# Patient Record
Sex: Female | Born: 1970
Health system: Southern US, Community
[De-identification: ages and names within clinical notes are randomized; demographics above are authoritative.]

## PROBLEM LIST (undated history)

## (undated) ENCOUNTER — Emergency Department (HOSPITAL_BASED_OUTPATIENT_CLINIC_OR_DEPARTMENT_OTHER): Payer: 59

## (undated) DIAGNOSIS — I10 Essential (primary) hypertension: Secondary | ICD-10-CM

## (undated) DIAGNOSIS — T8859XA Other complications of anesthesia, initial encounter: Secondary | ICD-10-CM

## (undated) HISTORY — PX: KNEE ARTHROSCOPY: SHX127

---

## 2013-10-18 ENCOUNTER — Other Ambulatory Visit (HOSPITAL_COMMUNITY)
Admission: RE | Admit: 2013-10-18 | Discharge: 2013-10-18 | Disposition: A | Discharge status: Home / Self Care | Payer: Commercial Indemnity | Source: Ambulatory Visit | Attending: Obstetrics & Gynecology | Admitting: Obstetrics & Gynecology

## 2013-10-18 DIAGNOSIS — Z01419 Encounter for gynecological examination (general) (routine) without abnormal findings: Secondary | ICD-10-CM | POA: Insufficient documentation

## 2013-10-18 DIAGNOSIS — Z1151 Encounter for screening for human papillomavirus (HPV): Secondary | ICD-10-CM | POA: Insufficient documentation

## 2014-08-06 ENCOUNTER — Other Ambulatory Visit: Payer: Self-pay

## 2014-08-06 DIAGNOSIS — Z1231 Encounter for screening mammogram for malignant neoplasm of breast: Secondary | ICD-10-CM

## 2014-08-21 ENCOUNTER — Ambulatory Visit
Admission: RE | Admit: 2014-08-21 | Discharge: 2014-08-21 | Disposition: A | Discharge status: Home / Self Care | Payer: BLUE CROSS/BLUE SHIELD | Source: Ambulatory Visit

## 2014-08-21 DIAGNOSIS — Z1231 Encounter for screening mammogram for malignant neoplasm of breast: Secondary | ICD-10-CM

## 2016-01-02 ENCOUNTER — Encounter (INDEPENDENT_AMBULATORY_CARE_PROVIDER_SITE_OTHER): Payer: 59 | Admitting: Ophthalmology

## 2016-01-02 DIAGNOSIS — H33022 Retinal detachment with multiple breaks, left eye: Secondary | ICD-10-CM

## 2016-01-02 DIAGNOSIS — I10 Essential (primary) hypertension: Secondary | ICD-10-CM

## 2016-01-02 DIAGNOSIS — H43813 Vitreous degeneration, bilateral: Secondary | ICD-10-CM | POA: Diagnosis not present

## 2016-01-02 DIAGNOSIS — H2512 Age-related nuclear cataract, left eye: Secondary | ICD-10-CM

## 2016-01-02 DIAGNOSIS — H33301 Unspecified retinal break, right eye: Secondary | ICD-10-CM

## 2016-01-02 DIAGNOSIS — H35413 Lattice degeneration of retina, bilateral: Secondary | ICD-10-CM | POA: Diagnosis not present

## 2016-01-02 DIAGNOSIS — H35033 Hypertensive retinopathy, bilateral: Secondary | ICD-10-CM

## 2016-01-08 ENCOUNTER — Encounter (INDEPENDENT_AMBULATORY_CARE_PROVIDER_SITE_OTHER): Payer: 59 | Admitting: Ophthalmology

## 2016-01-08 DIAGNOSIS — H33301 Unspecified retinal break, right eye: Secondary | ICD-10-CM | POA: Diagnosis not present

## 2016-01-29 ENCOUNTER — Ambulatory Visit (INDEPENDENT_AMBULATORY_CARE_PROVIDER_SITE_OTHER): Payer: 59 | Admitting: Ophthalmology

## 2016-02-02 ENCOUNTER — Ambulatory Visit (INDEPENDENT_AMBULATORY_CARE_PROVIDER_SITE_OTHER): Payer: 59 | Admitting: Ophthalmology

## 2016-02-02 DIAGNOSIS — H33333 Multiple defects of retina without detachment, bilateral: Secondary | ICD-10-CM

## 2016-06-03 ENCOUNTER — Ambulatory Visit (INDEPENDENT_AMBULATORY_CARE_PROVIDER_SITE_OTHER): Payer: 59 | Admitting: Ophthalmology

## 2016-06-25 ENCOUNTER — Ambulatory Visit (INDEPENDENT_AMBULATORY_CARE_PROVIDER_SITE_OTHER): Payer: 59 | Admitting: Ophthalmology

## 2016-06-25 DIAGNOSIS — H35033 Hypertensive retinopathy, bilateral: Secondary | ICD-10-CM | POA: Diagnosis not present

## 2016-06-25 DIAGNOSIS — H33303 Unspecified retinal break, bilateral: Secondary | ICD-10-CM

## 2016-06-25 DIAGNOSIS — I10 Essential (primary) hypertension: Secondary | ICD-10-CM | POA: Diagnosis not present

## 2016-06-25 DIAGNOSIS — H43813 Vitreous degeneration, bilateral: Secondary | ICD-10-CM | POA: Diagnosis not present

## 2016-07-02 DIAGNOSIS — J343 Hypertrophy of nasal turbinates: Secondary | ICD-10-CM | POA: Diagnosis not present

## 2016-07-02 DIAGNOSIS — J310 Chronic rhinitis: Secondary | ICD-10-CM | POA: Diagnosis not present

## 2016-07-16 ENCOUNTER — Other Ambulatory Visit (INDEPENDENT_AMBULATORY_CARE_PROVIDER_SITE_OTHER): Payer: Self-pay | Admitting: Otolaryngology

## 2016-07-16 DIAGNOSIS — J329 Chronic sinusitis, unspecified: Secondary | ICD-10-CM

## 2016-07-19 ENCOUNTER — Ambulatory Visit
Admission: RE | Admit: 2016-07-19 | Discharge: 2016-07-19 | Disposition: A | Discharge status: Home / Self Care | Payer: 59 | Source: Ambulatory Visit | Attending: Otolaryngology | Admitting: Otolaryngology

## 2016-07-19 DIAGNOSIS — R51 Headache: Secondary | ICD-10-CM | POA: Diagnosis not present

## 2016-07-19 DIAGNOSIS — J329 Chronic sinusitis, unspecified: Secondary | ICD-10-CM

## 2016-07-21 DIAGNOSIS — J310 Chronic rhinitis: Secondary | ICD-10-CM | POA: Diagnosis not present

## 2016-07-21 DIAGNOSIS — J343 Hypertrophy of nasal turbinates: Secondary | ICD-10-CM | POA: Diagnosis not present

## 2016-07-21 DIAGNOSIS — Z23 Encounter for immunization: Secondary | ICD-10-CM | POA: Diagnosis not present

## 2016-08-11 DIAGNOSIS — J343 Hypertrophy of nasal turbinates: Secondary | ICD-10-CM | POA: Diagnosis not present

## 2016-08-11 DIAGNOSIS — J310 Chronic rhinitis: Secondary | ICD-10-CM | POA: Diagnosis not present

## 2016-09-16 DIAGNOSIS — H1045 Other chronic allergic conjunctivitis: Secondary | ICD-10-CM | POA: Diagnosis not present

## 2016-09-16 DIAGNOSIS — J300 Vasomotor rhinitis: Secondary | ICD-10-CM | POA: Diagnosis not present

## 2016-09-16 DIAGNOSIS — T50995D Adverse effect of other drugs, medicaments and biological substances, subsequent encounter: Secondary | ICD-10-CM | POA: Diagnosis not present

## 2016-09-21 DIAGNOSIS — J301 Allergic rhinitis due to pollen: Secondary | ICD-10-CM | POA: Diagnosis not present

## 2016-09-22 DIAGNOSIS — J3081 Allergic rhinitis due to animal (cat) (dog) hair and dander: Secondary | ICD-10-CM | POA: Diagnosis not present

## 2016-09-22 DIAGNOSIS — J3089 Other allergic rhinitis: Secondary | ICD-10-CM | POA: Diagnosis not present

## 2016-09-29 DIAGNOSIS — J342 Deviated nasal septum: Secondary | ICD-10-CM | POA: Diagnosis not present

## 2016-09-29 DIAGNOSIS — J343 Hypertrophy of nasal turbinates: Secondary | ICD-10-CM | POA: Diagnosis not present

## 2016-10-14 DIAGNOSIS — J3089 Other allergic rhinitis: Secondary | ICD-10-CM | POA: Diagnosis not present

## 2016-10-14 DIAGNOSIS — J301 Allergic rhinitis due to pollen: Secondary | ICD-10-CM | POA: Diagnosis not present

## 2016-10-14 DIAGNOSIS — J3081 Allergic rhinitis due to animal (cat) (dog) hair and dander: Secondary | ICD-10-CM | POA: Diagnosis not present

## 2016-10-19 DIAGNOSIS — J3089 Other allergic rhinitis: Secondary | ICD-10-CM | POA: Diagnosis not present

## 2016-10-19 DIAGNOSIS — J3081 Allergic rhinitis due to animal (cat) (dog) hair and dander: Secondary | ICD-10-CM | POA: Diagnosis not present

## 2016-10-19 DIAGNOSIS — J301 Allergic rhinitis due to pollen: Secondary | ICD-10-CM | POA: Diagnosis not present

## 2016-10-22 DIAGNOSIS — J301 Allergic rhinitis due to pollen: Secondary | ICD-10-CM | POA: Diagnosis not present

## 2016-10-22 DIAGNOSIS — J3089 Other allergic rhinitis: Secondary | ICD-10-CM | POA: Diagnosis not present

## 2016-10-22 DIAGNOSIS — J3081 Allergic rhinitis due to animal (cat) (dog) hair and dander: Secondary | ICD-10-CM | POA: Diagnosis not present

## 2016-10-26 DIAGNOSIS — J301 Allergic rhinitis due to pollen: Secondary | ICD-10-CM | POA: Diagnosis not present

## 2016-10-26 DIAGNOSIS — J3081 Allergic rhinitis due to animal (cat) (dog) hair and dander: Secondary | ICD-10-CM | POA: Diagnosis not present

## 2016-10-26 DIAGNOSIS — J3089 Other allergic rhinitis: Secondary | ICD-10-CM | POA: Diagnosis not present

## 2016-10-29 DIAGNOSIS — J301 Allergic rhinitis due to pollen: Secondary | ICD-10-CM | POA: Diagnosis not present

## 2016-10-29 DIAGNOSIS — J3081 Allergic rhinitis due to animal (cat) (dog) hair and dander: Secondary | ICD-10-CM | POA: Diagnosis not present

## 2016-10-29 DIAGNOSIS — J3089 Other allergic rhinitis: Secondary | ICD-10-CM | POA: Diagnosis not present

## 2016-11-02 DIAGNOSIS — J301 Allergic rhinitis due to pollen: Secondary | ICD-10-CM | POA: Diagnosis not present

## 2016-11-02 DIAGNOSIS — J3081 Allergic rhinitis due to animal (cat) (dog) hair and dander: Secondary | ICD-10-CM | POA: Diagnosis not present

## 2016-11-02 DIAGNOSIS — J3089 Other allergic rhinitis: Secondary | ICD-10-CM | POA: Diagnosis not present

## 2016-11-05 DIAGNOSIS — J301 Allergic rhinitis due to pollen: Secondary | ICD-10-CM | POA: Diagnosis not present

## 2016-11-05 DIAGNOSIS — J3089 Other allergic rhinitis: Secondary | ICD-10-CM | POA: Diagnosis not present

## 2016-11-05 DIAGNOSIS — J3081 Allergic rhinitis due to animal (cat) (dog) hair and dander: Secondary | ICD-10-CM | POA: Diagnosis not present

## 2016-11-09 ENCOUNTER — Other Ambulatory Visit: Payer: Self-pay | Admitting: Obstetrics & Gynecology

## 2016-11-09 ENCOUNTER — Other Ambulatory Visit (HOSPITAL_COMMUNITY)
Admission: RE | Admit: 2016-11-09 | Discharge: 2016-11-09 | Disposition: A | Discharge status: Home / Self Care | Payer: 59 | Source: Ambulatory Visit | Attending: Obstetrics & Gynecology | Admitting: Obstetrics & Gynecology

## 2016-11-09 DIAGNOSIS — Z124 Encounter for screening for malignant neoplasm of cervix: Secondary | ICD-10-CM | POA: Diagnosis not present

## 2016-11-09 DIAGNOSIS — J310 Chronic rhinitis: Secondary | ICD-10-CM | POA: Diagnosis not present

## 2016-11-09 DIAGNOSIS — Z01419 Encounter for gynecological examination (general) (routine) without abnormal findings: Secondary | ICD-10-CM | POA: Diagnosis not present

## 2016-11-09 DIAGNOSIS — J343 Hypertrophy of nasal turbinates: Secondary | ICD-10-CM | POA: Diagnosis not present

## 2016-11-12 LAB — CYTOLOGY - PAP
DIAGNOSIS: NEGATIVE
HPV: NOT DETECTED

## 2016-11-19 ENCOUNTER — Other Ambulatory Visit (INDEPENDENT_AMBULATORY_CARE_PROVIDER_SITE_OTHER): Payer: Self-pay | Admitting: Otolaryngology

## 2016-11-19 DIAGNOSIS — J343 Hypertrophy of nasal turbinates: Secondary | ICD-10-CM | POA: Diagnosis not present

## 2016-11-19 DIAGNOSIS — J342 Deviated nasal septum: Secondary | ICD-10-CM | POA: Diagnosis not present

## 2016-11-19 DIAGNOSIS — J3489 Other specified disorders of nose and nasal sinuses: Secondary | ICD-10-CM | POA: Diagnosis not present

## 2016-12-06 DIAGNOSIS — J3081 Allergic rhinitis due to animal (cat) (dog) hair and dander: Secondary | ICD-10-CM | POA: Diagnosis not present

## 2016-12-06 DIAGNOSIS — J301 Allergic rhinitis due to pollen: Secondary | ICD-10-CM | POA: Diagnosis not present

## 2016-12-06 DIAGNOSIS — J3089 Other allergic rhinitis: Secondary | ICD-10-CM | POA: Diagnosis not present

## 2016-12-08 DIAGNOSIS — J3081 Allergic rhinitis due to animal (cat) (dog) hair and dander: Secondary | ICD-10-CM | POA: Diagnosis not present

## 2016-12-08 DIAGNOSIS — J3089 Other allergic rhinitis: Secondary | ICD-10-CM | POA: Diagnosis not present

## 2016-12-08 DIAGNOSIS — J301 Allergic rhinitis due to pollen: Secondary | ICD-10-CM | POA: Diagnosis not present

## 2016-12-20 DIAGNOSIS — J3081 Allergic rhinitis due to animal (cat) (dog) hair and dander: Secondary | ICD-10-CM | POA: Diagnosis not present

## 2016-12-20 DIAGNOSIS — J3089 Other allergic rhinitis: Secondary | ICD-10-CM | POA: Diagnosis not present

## 2016-12-20 DIAGNOSIS — J301 Allergic rhinitis due to pollen: Secondary | ICD-10-CM | POA: Diagnosis not present

## 2016-12-24 DIAGNOSIS — J301 Allergic rhinitis due to pollen: Secondary | ICD-10-CM | POA: Diagnosis not present

## 2016-12-24 DIAGNOSIS — J3081 Allergic rhinitis due to animal (cat) (dog) hair and dander: Secondary | ICD-10-CM | POA: Diagnosis not present

## 2016-12-24 DIAGNOSIS — J3089 Other allergic rhinitis: Secondary | ICD-10-CM | POA: Diagnosis not present

## 2016-12-29 DIAGNOSIS — J3081 Allergic rhinitis due to animal (cat) (dog) hair and dander: Secondary | ICD-10-CM | POA: Diagnosis not present

## 2016-12-29 DIAGNOSIS — J301 Allergic rhinitis due to pollen: Secondary | ICD-10-CM | POA: Diagnosis not present

## 2016-12-29 DIAGNOSIS — J3089 Other allergic rhinitis: Secondary | ICD-10-CM | POA: Diagnosis not present

## 2017-01-11 DIAGNOSIS — J3081 Allergic rhinitis due to animal (cat) (dog) hair and dander: Secondary | ICD-10-CM | POA: Diagnosis not present

## 2017-01-11 DIAGNOSIS — J3089 Other allergic rhinitis: Secondary | ICD-10-CM | POA: Diagnosis not present

## 2017-01-11 DIAGNOSIS — J301 Allergic rhinitis due to pollen: Secondary | ICD-10-CM | POA: Diagnosis not present

## 2017-01-14 DIAGNOSIS — J3089 Other allergic rhinitis: Secondary | ICD-10-CM | POA: Diagnosis not present

## 2017-01-14 DIAGNOSIS — J3081 Allergic rhinitis due to animal (cat) (dog) hair and dander: Secondary | ICD-10-CM | POA: Diagnosis not present

## 2017-01-14 DIAGNOSIS — J301 Allergic rhinitis due to pollen: Secondary | ICD-10-CM | POA: Diagnosis not present

## 2017-01-25 DIAGNOSIS — J3081 Allergic rhinitis due to animal (cat) (dog) hair and dander: Secondary | ICD-10-CM | POA: Diagnosis not present

## 2017-01-25 DIAGNOSIS — J3089 Other allergic rhinitis: Secondary | ICD-10-CM | POA: Diagnosis not present

## 2017-01-25 DIAGNOSIS — J301 Allergic rhinitis due to pollen: Secondary | ICD-10-CM | POA: Diagnosis not present

## 2017-01-31 DIAGNOSIS — J3081 Allergic rhinitis due to animal (cat) (dog) hair and dander: Secondary | ICD-10-CM | POA: Diagnosis not present

## 2017-01-31 DIAGNOSIS — J3089 Other allergic rhinitis: Secondary | ICD-10-CM | POA: Diagnosis not present

## 2017-01-31 DIAGNOSIS — J301 Allergic rhinitis due to pollen: Secondary | ICD-10-CM | POA: Diagnosis not present

## 2017-02-02 DIAGNOSIS — J3081 Allergic rhinitis due to animal (cat) (dog) hair and dander: Secondary | ICD-10-CM | POA: Diagnosis not present

## 2017-02-02 DIAGNOSIS — J301 Allergic rhinitis due to pollen: Secondary | ICD-10-CM | POA: Diagnosis not present

## 2017-02-02 DIAGNOSIS — J3089 Other allergic rhinitis: Secondary | ICD-10-CM | POA: Diagnosis not present

## 2017-02-07 DIAGNOSIS — J301 Allergic rhinitis due to pollen: Secondary | ICD-10-CM | POA: Diagnosis not present

## 2017-02-07 DIAGNOSIS — J3081 Allergic rhinitis due to animal (cat) (dog) hair and dander: Secondary | ICD-10-CM | POA: Diagnosis not present

## 2017-02-07 DIAGNOSIS — J3089 Other allergic rhinitis: Secondary | ICD-10-CM | POA: Diagnosis not present

## 2017-02-09 DIAGNOSIS — J3089 Other allergic rhinitis: Secondary | ICD-10-CM | POA: Diagnosis not present

## 2017-02-09 DIAGNOSIS — J301 Allergic rhinitis due to pollen: Secondary | ICD-10-CM | POA: Diagnosis not present

## 2017-02-09 DIAGNOSIS — J3081 Allergic rhinitis due to animal (cat) (dog) hair and dander: Secondary | ICD-10-CM | POA: Diagnosis not present

## 2017-02-14 DIAGNOSIS — J301 Allergic rhinitis due to pollen: Secondary | ICD-10-CM | POA: Diagnosis not present

## 2017-02-14 DIAGNOSIS — J3089 Other allergic rhinitis: Secondary | ICD-10-CM | POA: Diagnosis not present

## 2017-02-14 DIAGNOSIS — J3081 Allergic rhinitis due to animal (cat) (dog) hair and dander: Secondary | ICD-10-CM | POA: Diagnosis not present

## 2017-02-28 DIAGNOSIS — J301 Allergic rhinitis due to pollen: Secondary | ICD-10-CM | POA: Diagnosis not present

## 2017-02-28 DIAGNOSIS — J3081 Allergic rhinitis due to animal (cat) (dog) hair and dander: Secondary | ICD-10-CM | POA: Diagnosis not present

## 2017-02-28 DIAGNOSIS — J3089 Other allergic rhinitis: Secondary | ICD-10-CM | POA: Diagnosis not present

## 2017-03-14 DIAGNOSIS — J3089 Other allergic rhinitis: Secondary | ICD-10-CM | POA: Diagnosis not present

## 2017-03-14 DIAGNOSIS — J3081 Allergic rhinitis due to animal (cat) (dog) hair and dander: Secondary | ICD-10-CM | POA: Diagnosis not present

## 2017-03-14 DIAGNOSIS — J301 Allergic rhinitis due to pollen: Secondary | ICD-10-CM | POA: Diagnosis not present

## 2017-03-17 DIAGNOSIS — J3089 Other allergic rhinitis: Secondary | ICD-10-CM | POA: Diagnosis not present

## 2017-03-17 DIAGNOSIS — J301 Allergic rhinitis due to pollen: Secondary | ICD-10-CM | POA: Diagnosis not present

## 2017-03-17 DIAGNOSIS — J3081 Allergic rhinitis due to animal (cat) (dog) hair and dander: Secondary | ICD-10-CM | POA: Diagnosis not present

## 2017-03-22 DIAGNOSIS — J3081 Allergic rhinitis due to animal (cat) (dog) hair and dander: Secondary | ICD-10-CM | POA: Diagnosis not present

## 2017-03-22 DIAGNOSIS — J3089 Other allergic rhinitis: Secondary | ICD-10-CM | POA: Diagnosis not present

## 2017-03-22 DIAGNOSIS — J301 Allergic rhinitis due to pollen: Secondary | ICD-10-CM | POA: Diagnosis not present

## 2017-03-25 DIAGNOSIS — J3089 Other allergic rhinitis: Secondary | ICD-10-CM | POA: Diagnosis not present

## 2017-03-25 DIAGNOSIS — J3081 Allergic rhinitis due to animal (cat) (dog) hair and dander: Secondary | ICD-10-CM | POA: Diagnosis not present

## 2017-03-25 DIAGNOSIS — J301 Allergic rhinitis due to pollen: Secondary | ICD-10-CM | POA: Diagnosis not present

## 2017-03-28 DIAGNOSIS — J3081 Allergic rhinitis due to animal (cat) (dog) hair and dander: Secondary | ICD-10-CM | POA: Diagnosis not present

## 2017-03-28 DIAGNOSIS — J3089 Other allergic rhinitis: Secondary | ICD-10-CM | POA: Diagnosis not present

## 2017-03-28 DIAGNOSIS — Z23 Encounter for immunization: Secondary | ICD-10-CM | POA: Diagnosis not present

## 2017-03-28 DIAGNOSIS — J301 Allergic rhinitis due to pollen: Secondary | ICD-10-CM | POA: Diagnosis not present

## 2017-03-30 DIAGNOSIS — J301 Allergic rhinitis due to pollen: Secondary | ICD-10-CM | POA: Diagnosis not present

## 2017-03-30 DIAGNOSIS — J3089 Other allergic rhinitis: Secondary | ICD-10-CM | POA: Diagnosis not present

## 2017-03-30 DIAGNOSIS — J3081 Allergic rhinitis due to animal (cat) (dog) hair and dander: Secondary | ICD-10-CM | POA: Diagnosis not present

## 2017-04-05 DIAGNOSIS — J329 Chronic sinusitis, unspecified: Secondary | ICD-10-CM | POA: Diagnosis not present

## 2017-04-05 DIAGNOSIS — J301 Allergic rhinitis due to pollen: Secondary | ICD-10-CM | POA: Diagnosis not present

## 2017-04-05 DIAGNOSIS — T50995D Adverse effect of other drugs, medicaments and biological substances, subsequent encounter: Secondary | ICD-10-CM | POA: Diagnosis not present

## 2017-04-05 DIAGNOSIS — J3089 Other allergic rhinitis: Secondary | ICD-10-CM | POA: Diagnosis not present

## 2017-04-05 DIAGNOSIS — J3081 Allergic rhinitis due to animal (cat) (dog) hair and dander: Secondary | ICD-10-CM | POA: Diagnosis not present

## 2017-05-24 DIAGNOSIS — J3081 Allergic rhinitis due to animal (cat) (dog) hair and dander: Secondary | ICD-10-CM | POA: Diagnosis not present

## 2017-05-24 DIAGNOSIS — J301 Allergic rhinitis due to pollen: Secondary | ICD-10-CM | POA: Diagnosis not present

## 2017-05-24 DIAGNOSIS — J3089 Other allergic rhinitis: Secondary | ICD-10-CM | POA: Diagnosis not present

## 2017-06-03 DIAGNOSIS — J3081 Allergic rhinitis due to animal (cat) (dog) hair and dander: Secondary | ICD-10-CM | POA: Diagnosis not present

## 2017-06-03 DIAGNOSIS — J301 Allergic rhinitis due to pollen: Secondary | ICD-10-CM | POA: Diagnosis not present

## 2017-06-03 DIAGNOSIS — J3089 Other allergic rhinitis: Secondary | ICD-10-CM | POA: Diagnosis not present

## 2017-06-09 DIAGNOSIS — J3089 Other allergic rhinitis: Secondary | ICD-10-CM | POA: Diagnosis not present

## 2017-06-09 DIAGNOSIS — J3081 Allergic rhinitis due to animal (cat) (dog) hair and dander: Secondary | ICD-10-CM | POA: Diagnosis not present

## 2017-06-09 DIAGNOSIS — J301 Allergic rhinitis due to pollen: Secondary | ICD-10-CM | POA: Diagnosis not present

## 2017-06-17 DIAGNOSIS — J3089 Other allergic rhinitis: Secondary | ICD-10-CM | POA: Diagnosis not present

## 2017-06-17 DIAGNOSIS — J3081 Allergic rhinitis due to animal (cat) (dog) hair and dander: Secondary | ICD-10-CM | POA: Diagnosis not present

## 2017-06-17 DIAGNOSIS — J301 Allergic rhinitis due to pollen: Secondary | ICD-10-CM | POA: Diagnosis not present

## 2017-06-20 DIAGNOSIS — J3089 Other allergic rhinitis: Secondary | ICD-10-CM | POA: Diagnosis not present

## 2017-06-20 DIAGNOSIS — J3081 Allergic rhinitis due to animal (cat) (dog) hair and dander: Secondary | ICD-10-CM | POA: Diagnosis not present

## 2017-06-24 DIAGNOSIS — J301 Allergic rhinitis due to pollen: Secondary | ICD-10-CM | POA: Diagnosis not present

## 2017-06-24 DIAGNOSIS — J3081 Allergic rhinitis due to animal (cat) (dog) hair and dander: Secondary | ICD-10-CM | POA: Diagnosis not present

## 2017-06-24 DIAGNOSIS — J3089 Other allergic rhinitis: Secondary | ICD-10-CM | POA: Diagnosis not present

## 2017-06-27 DIAGNOSIS — J301 Allergic rhinitis due to pollen: Secondary | ICD-10-CM | POA: Diagnosis not present

## 2017-06-27 DIAGNOSIS — J3081 Allergic rhinitis due to animal (cat) (dog) hair and dander: Secondary | ICD-10-CM | POA: Diagnosis not present

## 2017-06-27 DIAGNOSIS — J3089 Other allergic rhinitis: Secondary | ICD-10-CM | POA: Diagnosis not present

## 2017-07-22 DIAGNOSIS — J3081 Allergic rhinitis due to animal (cat) (dog) hair and dander: Secondary | ICD-10-CM | POA: Diagnosis not present

## 2017-07-22 DIAGNOSIS — J301 Allergic rhinitis due to pollen: Secondary | ICD-10-CM | POA: Diagnosis not present

## 2017-07-22 DIAGNOSIS — J3089 Other allergic rhinitis: Secondary | ICD-10-CM | POA: Diagnosis not present

## 2017-07-26 DIAGNOSIS — J343 Hypertrophy of nasal turbinates: Secondary | ICD-10-CM | POA: Diagnosis not present

## 2017-07-26 DIAGNOSIS — J3081 Allergic rhinitis due to animal (cat) (dog) hair and dander: Secondary | ICD-10-CM | POA: Diagnosis not present

## 2017-07-26 DIAGNOSIS — J3089 Other allergic rhinitis: Secondary | ICD-10-CM | POA: Diagnosis not present

## 2017-07-26 DIAGNOSIS — J301 Allergic rhinitis due to pollen: Secondary | ICD-10-CM | POA: Diagnosis not present

## 2017-07-26 DIAGNOSIS — J310 Chronic rhinitis: Secondary | ICD-10-CM | POA: Diagnosis not present

## 2017-08-16 DIAGNOSIS — J3081 Allergic rhinitis due to animal (cat) (dog) hair and dander: Secondary | ICD-10-CM | POA: Diagnosis not present

## 2017-08-16 DIAGNOSIS — J301 Allergic rhinitis due to pollen: Secondary | ICD-10-CM | POA: Diagnosis not present

## 2017-08-16 DIAGNOSIS — J3089 Other allergic rhinitis: Secondary | ICD-10-CM | POA: Diagnosis not present

## 2017-08-26 DIAGNOSIS — J301 Allergic rhinitis due to pollen: Secondary | ICD-10-CM | POA: Diagnosis not present

## 2017-08-26 DIAGNOSIS — J3081 Allergic rhinitis due to animal (cat) (dog) hair and dander: Secondary | ICD-10-CM | POA: Diagnosis not present

## 2017-08-26 DIAGNOSIS — J3089 Other allergic rhinitis: Secondary | ICD-10-CM | POA: Diagnosis not present

## 2017-09-06 DIAGNOSIS — J3089 Other allergic rhinitis: Secondary | ICD-10-CM | POA: Diagnosis not present

## 2017-09-06 DIAGNOSIS — J3081 Allergic rhinitis due to animal (cat) (dog) hair and dander: Secondary | ICD-10-CM | POA: Diagnosis not present

## 2017-09-06 DIAGNOSIS — J301 Allergic rhinitis due to pollen: Secondary | ICD-10-CM | POA: Diagnosis not present

## 2017-09-20 DIAGNOSIS — J301 Allergic rhinitis due to pollen: Secondary | ICD-10-CM | POA: Diagnosis not present

## 2017-09-20 DIAGNOSIS — J3089 Other allergic rhinitis: Secondary | ICD-10-CM | POA: Diagnosis not present

## 2017-09-20 DIAGNOSIS — J3081 Allergic rhinitis due to animal (cat) (dog) hair and dander: Secondary | ICD-10-CM | POA: Diagnosis not present

## 2017-09-23 DIAGNOSIS — J3081 Allergic rhinitis due to animal (cat) (dog) hair and dander: Secondary | ICD-10-CM | POA: Diagnosis not present

## 2017-09-23 DIAGNOSIS — J301 Allergic rhinitis due to pollen: Secondary | ICD-10-CM | POA: Diagnosis not present

## 2017-09-23 DIAGNOSIS — J3089 Other allergic rhinitis: Secondary | ICD-10-CM | POA: Diagnosis not present

## 2017-09-30 DIAGNOSIS — J301 Allergic rhinitis due to pollen: Secondary | ICD-10-CM | POA: Diagnosis not present

## 2017-09-30 DIAGNOSIS — J3089 Other allergic rhinitis: Secondary | ICD-10-CM | POA: Diagnosis not present

## 2017-09-30 DIAGNOSIS — J3081 Allergic rhinitis due to animal (cat) (dog) hair and dander: Secondary | ICD-10-CM | POA: Diagnosis not present

## 2017-10-13 DIAGNOSIS — J3089 Other allergic rhinitis: Secondary | ICD-10-CM | POA: Diagnosis not present

## 2017-10-13 DIAGNOSIS — J3081 Allergic rhinitis due to animal (cat) (dog) hair and dander: Secondary | ICD-10-CM | POA: Diagnosis not present

## 2017-10-13 DIAGNOSIS — J301 Allergic rhinitis due to pollen: Secondary | ICD-10-CM | POA: Diagnosis not present

## 2017-10-21 DIAGNOSIS — J3081 Allergic rhinitis due to animal (cat) (dog) hair and dander: Secondary | ICD-10-CM | POA: Diagnosis not present

## 2017-10-21 DIAGNOSIS — J3089 Other allergic rhinitis: Secondary | ICD-10-CM | POA: Diagnosis not present

## 2017-10-21 DIAGNOSIS — J301 Allergic rhinitis due to pollen: Secondary | ICD-10-CM | POA: Diagnosis not present

## 2017-10-25 ENCOUNTER — Other Ambulatory Visit: Payer: Self-pay | Admitting: Obstetrics & Gynecology

## 2017-10-25 DIAGNOSIS — Z1231 Encounter for screening mammogram for malignant neoplasm of breast: Secondary | ICD-10-CM

## 2017-11-01 DIAGNOSIS — Z23 Encounter for immunization: Secondary | ICD-10-CM | POA: Diagnosis not present

## 2017-11-01 DIAGNOSIS — Z0184 Encounter for antibody response examination: Secondary | ICD-10-CM | POA: Diagnosis not present

## 2017-11-02 DIAGNOSIS — J301 Allergic rhinitis due to pollen: Secondary | ICD-10-CM | POA: Diagnosis not present

## 2017-11-02 DIAGNOSIS — J3081 Allergic rhinitis due to animal (cat) (dog) hair and dander: Secondary | ICD-10-CM | POA: Diagnosis not present

## 2017-11-02 DIAGNOSIS — Z111 Encounter for screening for respiratory tuberculosis: Secondary | ICD-10-CM | POA: Diagnosis not present

## 2017-11-02 DIAGNOSIS — J3089 Other allergic rhinitis: Secondary | ICD-10-CM | POA: Diagnosis not present

## 2017-11-10 ENCOUNTER — Ambulatory Visit
Admission: RE | Admit: 2017-11-10 | Discharge: 2017-11-10 | Disposition: A | Discharge status: Home / Self Care | Payer: 59 | Source: Ambulatory Visit | Attending: Obstetrics & Gynecology | Admitting: Obstetrics & Gynecology

## 2017-11-10 DIAGNOSIS — Z1231 Encounter for screening mammogram for malignant neoplasm of breast: Secondary | ICD-10-CM

## 2017-11-24 DIAGNOSIS — J3089 Other allergic rhinitis: Secondary | ICD-10-CM | POA: Diagnosis not present

## 2017-11-24 DIAGNOSIS — J301 Allergic rhinitis due to pollen: Secondary | ICD-10-CM | POA: Diagnosis not present

## 2017-11-24 DIAGNOSIS — J3081 Allergic rhinitis due to animal (cat) (dog) hair and dander: Secondary | ICD-10-CM | POA: Diagnosis not present

## 2017-11-30 DIAGNOSIS — Z111 Encounter for screening for respiratory tuberculosis: Secondary | ICD-10-CM | POA: Diagnosis not present

## 2018-01-05 DIAGNOSIS — J3089 Other allergic rhinitis: Secondary | ICD-10-CM | POA: Diagnosis not present

## 2018-01-05 DIAGNOSIS — J3081 Allergic rhinitis due to animal (cat) (dog) hair and dander: Secondary | ICD-10-CM | POA: Diagnosis not present

## 2018-01-05 DIAGNOSIS — J301 Allergic rhinitis due to pollen: Secondary | ICD-10-CM | POA: Diagnosis not present

## 2018-01-11 DIAGNOSIS — I10 Essential (primary) hypertension: Secondary | ICD-10-CM | POA: Diagnosis not present

## 2018-01-11 DIAGNOSIS — J309 Allergic rhinitis, unspecified: Secondary | ICD-10-CM | POA: Diagnosis not present

## 2018-01-11 DIAGNOSIS — Z23 Encounter for immunization: Secondary | ICD-10-CM | POA: Diagnosis not present

## 2018-01-12 DIAGNOSIS — J3081 Allergic rhinitis due to animal (cat) (dog) hair and dander: Secondary | ICD-10-CM | POA: Diagnosis not present

## 2018-01-12 DIAGNOSIS — J301 Allergic rhinitis due to pollen: Secondary | ICD-10-CM | POA: Diagnosis not present

## 2018-01-12 DIAGNOSIS — J3089 Other allergic rhinitis: Secondary | ICD-10-CM | POA: Diagnosis not present

## 2018-01-20 DIAGNOSIS — H2513 Age-related nuclear cataract, bilateral: Secondary | ICD-10-CM | POA: Diagnosis not present

## 2018-01-20 DIAGNOSIS — H10413 Chronic giant papillary conjunctivitis, bilateral: Secondary | ICD-10-CM | POA: Diagnosis not present

## 2018-01-20 DIAGNOSIS — H35413 Lattice degeneration of retina, bilateral: Secondary | ICD-10-CM | POA: Diagnosis not present

## 2018-03-03 DIAGNOSIS — J3089 Other allergic rhinitis: Secondary | ICD-10-CM | POA: Diagnosis not present

## 2018-03-03 DIAGNOSIS — J3081 Allergic rhinitis due to animal (cat) (dog) hair and dander: Secondary | ICD-10-CM | POA: Diagnosis not present

## 2018-03-03 DIAGNOSIS — J301 Allergic rhinitis due to pollen: Secondary | ICD-10-CM | POA: Diagnosis not present

## 2018-03-08 DIAGNOSIS — J3089 Other allergic rhinitis: Secondary | ICD-10-CM | POA: Diagnosis not present

## 2018-03-08 DIAGNOSIS — J3081 Allergic rhinitis due to animal (cat) (dog) hair and dander: Secondary | ICD-10-CM | POA: Diagnosis not present

## 2018-03-08 DIAGNOSIS — J301 Allergic rhinitis due to pollen: Secondary | ICD-10-CM | POA: Diagnosis not present

## 2018-03-15 DIAGNOSIS — J3081 Allergic rhinitis due to animal (cat) (dog) hair and dander: Secondary | ICD-10-CM | POA: Diagnosis not present

## 2018-03-15 DIAGNOSIS — J3089 Other allergic rhinitis: Secondary | ICD-10-CM | POA: Diagnosis not present

## 2018-03-15 DIAGNOSIS — J301 Allergic rhinitis due to pollen: Secondary | ICD-10-CM | POA: Diagnosis not present

## 2018-03-23 DIAGNOSIS — J3081 Allergic rhinitis due to animal (cat) (dog) hair and dander: Secondary | ICD-10-CM | POA: Diagnosis not present

## 2018-03-23 DIAGNOSIS — J301 Allergic rhinitis due to pollen: Secondary | ICD-10-CM | POA: Diagnosis not present

## 2018-03-23 DIAGNOSIS — J3089 Other allergic rhinitis: Secondary | ICD-10-CM | POA: Diagnosis not present

## 2018-03-29 DIAGNOSIS — J301 Allergic rhinitis due to pollen: Secondary | ICD-10-CM | POA: Diagnosis not present

## 2018-03-29 DIAGNOSIS — J3081 Allergic rhinitis due to animal (cat) (dog) hair and dander: Secondary | ICD-10-CM | POA: Diagnosis not present

## 2018-03-29 DIAGNOSIS — J3089 Other allergic rhinitis: Secondary | ICD-10-CM | POA: Diagnosis not present

## 2018-04-05 DIAGNOSIS — J301 Allergic rhinitis due to pollen: Secondary | ICD-10-CM | POA: Diagnosis not present

## 2018-04-05 DIAGNOSIS — H1045 Other chronic allergic conjunctivitis: Secondary | ICD-10-CM | POA: Diagnosis not present

## 2018-04-05 DIAGNOSIS — J329 Chronic sinusitis, unspecified: Secondary | ICD-10-CM | POA: Diagnosis not present

## 2018-04-05 DIAGNOSIS — R05 Cough: Secondary | ICD-10-CM | POA: Diagnosis not present

## 2018-04-06 ENCOUNTER — Other Ambulatory Visit: Payer: Self-pay | Admitting: Obstetrics & Gynecology

## 2018-04-06 DIAGNOSIS — Z3202 Encounter for pregnancy test, result negative: Secondary | ICD-10-CM | POA: Diagnosis not present

## 2018-04-06 DIAGNOSIS — N939 Abnormal uterine and vaginal bleeding, unspecified: Secondary | ICD-10-CM | POA: Diagnosis not present

## 2018-04-08 DIAGNOSIS — Z23 Encounter for immunization: Secondary | ICD-10-CM | POA: Diagnosis not present

## 2018-04-12 DIAGNOSIS — N939 Abnormal uterine and vaginal bleeding, unspecified: Secondary | ICD-10-CM | POA: Diagnosis not present

## 2018-04-22 DIAGNOSIS — M79672 Pain in left foot: Secondary | ICD-10-CM | POA: Diagnosis not present

## 2018-04-27 DIAGNOSIS — J301 Allergic rhinitis due to pollen: Secondary | ICD-10-CM | POA: Diagnosis not present

## 2018-04-27 DIAGNOSIS — J3089 Other allergic rhinitis: Secondary | ICD-10-CM | POA: Diagnosis not present

## 2018-04-27 DIAGNOSIS — J3081 Allergic rhinitis due to animal (cat) (dog) hair and dander: Secondary | ICD-10-CM | POA: Diagnosis not present

## 2018-05-02 DIAGNOSIS — J301 Allergic rhinitis due to pollen: Secondary | ICD-10-CM | POA: Diagnosis not present

## 2018-05-02 DIAGNOSIS — J3089 Other allergic rhinitis: Secondary | ICD-10-CM | POA: Diagnosis not present

## 2018-05-02 DIAGNOSIS — J3081 Allergic rhinitis due to animal (cat) (dog) hair and dander: Secondary | ICD-10-CM | POA: Diagnosis not present

## 2018-05-04 DIAGNOSIS — J3089 Other allergic rhinitis: Secondary | ICD-10-CM | POA: Diagnosis not present

## 2018-05-04 DIAGNOSIS — J301 Allergic rhinitis due to pollen: Secondary | ICD-10-CM | POA: Diagnosis not present

## 2018-05-04 DIAGNOSIS — J3081 Allergic rhinitis due to animal (cat) (dog) hair and dander: Secondary | ICD-10-CM | POA: Diagnosis not present

## 2018-05-10 DIAGNOSIS — Z23 Encounter for immunization: Secondary | ICD-10-CM | POA: Diagnosis not present

## 2018-05-12 DIAGNOSIS — J3081 Allergic rhinitis due to animal (cat) (dog) hair and dander: Secondary | ICD-10-CM | POA: Diagnosis not present

## 2018-05-12 DIAGNOSIS — J301 Allergic rhinitis due to pollen: Secondary | ICD-10-CM | POA: Diagnosis not present

## 2018-05-12 DIAGNOSIS — J3089 Other allergic rhinitis: Secondary | ICD-10-CM | POA: Diagnosis not present

## 2018-05-15 DIAGNOSIS — J3089 Other allergic rhinitis: Secondary | ICD-10-CM | POA: Diagnosis not present

## 2018-05-15 DIAGNOSIS — J301 Allergic rhinitis due to pollen: Secondary | ICD-10-CM | POA: Diagnosis not present

## 2018-05-15 DIAGNOSIS — J3081 Allergic rhinitis due to animal (cat) (dog) hair and dander: Secondary | ICD-10-CM | POA: Diagnosis not present

## 2018-05-16 DIAGNOSIS — J3089 Other allergic rhinitis: Secondary | ICD-10-CM | POA: Diagnosis not present

## 2018-05-16 DIAGNOSIS — J3081 Allergic rhinitis due to animal (cat) (dog) hair and dander: Secondary | ICD-10-CM | POA: Diagnosis not present

## 2018-05-26 DIAGNOSIS — J301 Allergic rhinitis due to pollen: Secondary | ICD-10-CM | POA: Diagnosis not present

## 2018-05-26 DIAGNOSIS — J3081 Allergic rhinitis due to animal (cat) (dog) hair and dander: Secondary | ICD-10-CM | POA: Diagnosis not present

## 2018-05-26 DIAGNOSIS — J3089 Other allergic rhinitis: Secondary | ICD-10-CM | POA: Diagnosis not present

## 2018-06-06 DIAGNOSIS — J3089 Other allergic rhinitis: Secondary | ICD-10-CM | POA: Diagnosis not present

## 2018-06-06 DIAGNOSIS — J3081 Allergic rhinitis due to animal (cat) (dog) hair and dander: Secondary | ICD-10-CM | POA: Diagnosis not present

## 2018-06-06 DIAGNOSIS — J301 Allergic rhinitis due to pollen: Secondary | ICD-10-CM | POA: Diagnosis not present

## 2018-06-12 DIAGNOSIS — R109 Unspecified abdominal pain: Secondary | ICD-10-CM | POA: Diagnosis not present

## 2018-06-13 DIAGNOSIS — J3089 Other allergic rhinitis: Secondary | ICD-10-CM | POA: Diagnosis not present

## 2018-06-13 DIAGNOSIS — J3081 Allergic rhinitis due to animal (cat) (dog) hair and dander: Secondary | ICD-10-CM | POA: Diagnosis not present

## 2018-06-13 DIAGNOSIS — J301 Allergic rhinitis due to pollen: Secondary | ICD-10-CM | POA: Diagnosis not present

## 2018-06-16 DIAGNOSIS — J301 Allergic rhinitis due to pollen: Secondary | ICD-10-CM | POA: Diagnosis not present

## 2018-06-16 DIAGNOSIS — J3081 Allergic rhinitis due to animal (cat) (dog) hair and dander: Secondary | ICD-10-CM | POA: Diagnosis not present

## 2018-06-16 DIAGNOSIS — J3089 Other allergic rhinitis: Secondary | ICD-10-CM | POA: Diagnosis not present

## 2018-06-28 DIAGNOSIS — J3081 Allergic rhinitis due to animal (cat) (dog) hair and dander: Secondary | ICD-10-CM | POA: Diagnosis not present

## 2018-06-28 DIAGNOSIS — J3089 Other allergic rhinitis: Secondary | ICD-10-CM | POA: Diagnosis not present

## 2018-06-28 DIAGNOSIS — J301 Allergic rhinitis due to pollen: Secondary | ICD-10-CM | POA: Diagnosis not present

## 2018-07-19 DIAGNOSIS — J3081 Allergic rhinitis due to animal (cat) (dog) hair and dander: Secondary | ICD-10-CM | POA: Diagnosis not present

## 2018-07-19 DIAGNOSIS — J3089 Other allergic rhinitis: Secondary | ICD-10-CM | POA: Diagnosis not present

## 2018-07-19 DIAGNOSIS — J301 Allergic rhinitis due to pollen: Secondary | ICD-10-CM | POA: Diagnosis not present

## 2018-07-28 DIAGNOSIS — J301 Allergic rhinitis due to pollen: Secondary | ICD-10-CM | POA: Diagnosis not present

## 2018-07-28 DIAGNOSIS — J3081 Allergic rhinitis due to animal (cat) (dog) hair and dander: Secondary | ICD-10-CM | POA: Diagnosis not present

## 2018-07-28 DIAGNOSIS — J3089 Other allergic rhinitis: Secondary | ICD-10-CM | POA: Diagnosis not present

## 2018-08-04 DIAGNOSIS — Z01411 Encounter for gynecological examination (general) (routine) with abnormal findings: Secondary | ICD-10-CM | POA: Diagnosis not present

## 2018-08-07 DIAGNOSIS — J3081 Allergic rhinitis due to animal (cat) (dog) hair and dander: Secondary | ICD-10-CM | POA: Diagnosis not present

## 2018-08-07 DIAGNOSIS — J301 Allergic rhinitis due to pollen: Secondary | ICD-10-CM | POA: Diagnosis not present

## 2018-08-07 DIAGNOSIS — J3089 Other allergic rhinitis: Secondary | ICD-10-CM | POA: Diagnosis not present

## 2018-08-10 DIAGNOSIS — J3081 Allergic rhinitis due to animal (cat) (dog) hair and dander: Secondary | ICD-10-CM | POA: Diagnosis not present

## 2018-08-10 DIAGNOSIS — J301 Allergic rhinitis due to pollen: Secondary | ICD-10-CM | POA: Diagnosis not present

## 2018-08-10 DIAGNOSIS — J3089 Other allergic rhinitis: Secondary | ICD-10-CM | POA: Diagnosis not present

## 2018-08-16 DIAGNOSIS — J301 Allergic rhinitis due to pollen: Secondary | ICD-10-CM | POA: Diagnosis not present

## 2018-08-16 DIAGNOSIS — J3089 Other allergic rhinitis: Secondary | ICD-10-CM | POA: Diagnosis not present

## 2018-08-16 DIAGNOSIS — J3081 Allergic rhinitis due to animal (cat) (dog) hair and dander: Secondary | ICD-10-CM | POA: Diagnosis not present

## 2018-08-25 DIAGNOSIS — J3089 Other allergic rhinitis: Secondary | ICD-10-CM | POA: Diagnosis not present

## 2018-08-25 DIAGNOSIS — J3081 Allergic rhinitis due to animal (cat) (dog) hair and dander: Secondary | ICD-10-CM | POA: Diagnosis not present

## 2018-08-25 DIAGNOSIS — J301 Allergic rhinitis due to pollen: Secondary | ICD-10-CM | POA: Diagnosis not present

## 2018-09-01 DIAGNOSIS — J3081 Allergic rhinitis due to animal (cat) (dog) hair and dander: Secondary | ICD-10-CM | POA: Diagnosis not present

## 2018-09-01 DIAGNOSIS — J3089 Other allergic rhinitis: Secondary | ICD-10-CM | POA: Diagnosis not present

## 2018-09-01 DIAGNOSIS — J301 Allergic rhinitis due to pollen: Secondary | ICD-10-CM | POA: Diagnosis not present

## 2018-09-04 DIAGNOSIS — M25511 Pain in right shoulder: Secondary | ICD-10-CM | POA: Diagnosis not present

## 2018-09-07 DIAGNOSIS — J3089 Other allergic rhinitis: Secondary | ICD-10-CM | POA: Diagnosis not present

## 2018-09-07 DIAGNOSIS — J301 Allergic rhinitis due to pollen: Secondary | ICD-10-CM | POA: Diagnosis not present

## 2018-09-07 DIAGNOSIS — J3081 Allergic rhinitis due to animal (cat) (dog) hair and dander: Secondary | ICD-10-CM | POA: Diagnosis not present

## 2018-09-08 DIAGNOSIS — M24811 Other specific joint derangements of right shoulder, not elsewhere classified: Secondary | ICD-10-CM | POA: Diagnosis not present

## 2018-09-15 DIAGNOSIS — J3089 Other allergic rhinitis: Secondary | ICD-10-CM | POA: Diagnosis not present

## 2018-09-15 DIAGNOSIS — J3081 Allergic rhinitis due to animal (cat) (dog) hair and dander: Secondary | ICD-10-CM | POA: Diagnosis not present

## 2018-09-15 DIAGNOSIS — J301 Allergic rhinitis due to pollen: Secondary | ICD-10-CM | POA: Diagnosis not present

## 2018-10-04 DIAGNOSIS — J3081 Allergic rhinitis due to animal (cat) (dog) hair and dander: Secondary | ICD-10-CM | POA: Diagnosis not present

## 2018-10-04 DIAGNOSIS — J301 Allergic rhinitis due to pollen: Secondary | ICD-10-CM | POA: Diagnosis not present

## 2018-10-04 DIAGNOSIS — J3089 Other allergic rhinitis: Secondary | ICD-10-CM | POA: Diagnosis not present

## 2018-10-11 DIAGNOSIS — M24811 Other specific joint derangements of right shoulder, not elsewhere classified: Secondary | ICD-10-CM | POA: Diagnosis not present

## 2018-10-23 DIAGNOSIS — J3081 Allergic rhinitis due to animal (cat) (dog) hair and dander: Secondary | ICD-10-CM | POA: Diagnosis not present

## 2018-10-23 DIAGNOSIS — J301 Allergic rhinitis due to pollen: Secondary | ICD-10-CM | POA: Diagnosis not present

## 2018-10-23 DIAGNOSIS — J3089 Other allergic rhinitis: Secondary | ICD-10-CM | POA: Diagnosis not present

## 2021-06-15 ENCOUNTER — Emergency Department (HOSPITAL_BASED_OUTPATIENT_CLINIC_OR_DEPARTMENT_OTHER): Payer: 59

## 2021-06-15 ENCOUNTER — Other Ambulatory Visit: Payer: Self-pay

## 2021-06-15 ENCOUNTER — Emergency Department (HOSPITAL_BASED_OUTPATIENT_CLINIC_OR_DEPARTMENT_OTHER)
Admission: EM | Admit: 2021-06-15 | Discharge: 2021-06-15 | Disposition: A | Discharge status: Home / Self Care | Payer: 59 | Source: Home / Self Care | Attending: Emergency Medicine | Admitting: Emergency Medicine

## 2021-06-15 ENCOUNTER — Encounter (HOSPITAL_BASED_OUTPATIENT_CLINIC_OR_DEPARTMENT_OTHER): Payer: Self-pay

## 2021-06-15 DIAGNOSIS — K7689 Other specified diseases of liver: Secondary | ICD-10-CM | POA: Insufficient documentation

## 2021-06-15 DIAGNOSIS — I10 Essential (primary) hypertension: Secondary | ICD-10-CM | POA: Insufficient documentation

## 2021-06-15 DIAGNOSIS — R1011 Right upper quadrant pain: Secondary | ICD-10-CM

## 2021-06-15 DIAGNOSIS — R7309 Other abnormal glucose: Secondary | ICD-10-CM | POA: Insufficient documentation

## 2021-06-15 HISTORY — DX: Essential (primary) hypertension: I10

## 2021-06-15 LAB — CBC WITH DIFFERENTIAL/PLATELET
Abs Immature Granulocytes: 0.05 10*3/uL (ref 0.00–0.07)
Basophils Absolute: 0.1 10*3/uL (ref 0.0–0.1)
Basophils Relative: 0 %
Eosinophils Absolute: 0 10*3/uL (ref 0.0–0.5)
Eosinophils Relative: 0 %
HCT: 39.7 % (ref 36.0–46.0)
Hemoglobin: 13.4 g/dL (ref 12.0–15.0)
Immature Granulocytes: 0 %
Lymphocytes Relative: 4 %
Lymphs Abs: 0.6 10*3/uL — ABNORMAL LOW (ref 0.7–4.0)
MCH: 28.8 pg (ref 26.0–34.0)
MCHC: 33.8 g/dL (ref 30.0–36.0)
MCV: 85.2 fL (ref 80.0–100.0)
Monocytes Absolute: 0.7 10*3/uL (ref 0.1–1.0)
Monocytes Relative: 5 %
Neutro Abs: 12.1 10*3/uL — ABNORMAL HIGH (ref 1.7–7.7)
Neutrophils Relative %: 91 %
Platelets: 222 10*3/uL (ref 150–400)
RBC: 4.66 MIL/uL (ref 3.87–5.11)
RDW: 12 % (ref 11.5–15.5)
WBC: 13.5 10*3/uL — ABNORMAL HIGH (ref 4.0–10.5)
nRBC: 0 % (ref 0.0–0.2)

## 2021-06-15 LAB — URINALYSIS, ROUTINE W REFLEX MICROSCOPIC
Bilirubin Urine: NEGATIVE
Glucose, UA: NEGATIVE mg/dL
Ketones, ur: NEGATIVE mg/dL
Leukocytes,Ua: NEGATIVE
Nitrite: POSITIVE — AB
Protein, ur: 30 mg/dL — AB
Specific Gravity, Urine: >1.046 — ABNORMAL HIGH (ref 1.005–1.030)
pH: 6 (ref 5.0–8.0)

## 2021-06-15 LAB — COMPREHENSIVE METABOLIC PANEL
ALT: 33 U/L (ref 0–44)
AST: 21 U/L (ref 15–41)
Albumin: 4.2 g/dL (ref 3.5–5.0)
Alkaline Phosphatase: 94 U/L (ref 38–126)
Anion gap: 10 (ref 5–15)
BUN: 10 mg/dL (ref 6–20)
CO2: 23 mmol/L (ref 22–32)
Calcium: 8.9 mg/dL (ref 8.9–10.3)
Chloride: 103 mmol/L (ref 98–111)
Creatinine, Ser: 0.53 mg/dL (ref 0.44–1.00)
GFR, Estimated: >60 mL/min (ref >60)
Glucose, Bld: 148 mg/dL — ABNORMAL HIGH (ref 70–99)
Potassium: 3.5 mmol/L (ref 3.5–5.1)
Sodium: 136 mmol/L (ref 135–145)
Total Bilirubin: 0.6 mg/dL (ref 0.3–1.2)
Total Protein: 7 g/dL (ref 6.5–8.1)

## 2021-06-15 LAB — D-DIMER, QUANTITATIVE: D-Dimer, Quant: 0.58 ug/mL-FEU — ABNORMAL HIGH (ref 0.00–0.50)

## 2021-06-15 LAB — LIPASE, BLOOD: Lipase: <10 U/L — ABNORMAL LOW (ref 11–51)

## 2021-06-15 MED ORDER — ONDANSETRON HCL 4 MG/2ML IJ SOLN
4.0000 mg | Freq: Once | INTRAMUSCULAR | Status: AC
  Administered 2021-06-15: 4 mg via INTRAVENOUS
  Filled 2021-06-15: qty 2

## 2021-06-15 MED ORDER — OXYCODONE-ACETAMINOPHEN 5-325 MG PO TABS
1.0000 | ORAL_TABLET | Freq: Once | ORAL | Status: AC
  Administered 2021-06-15: 1 via ORAL
  Filled 2021-06-15: qty 1

## 2021-06-15 MED ORDER — MORPHINE SULFATE (PF) 4 MG/ML IV SOLN
4.0000 mg | Freq: Once | INTRAVENOUS | Status: AC
  Administered 2021-06-15: 4 mg via INTRAVENOUS
  Filled 2021-06-15: qty 1

## 2021-06-15 MED ORDER — IOHEXOL 350 MG/ML SOLN
75.0000 mL | Freq: Once | INTRAVENOUS | Status: AC | PRN
  Administered 2021-06-15: 75 mL via INTRAVENOUS

## 2021-06-15 MED ORDER — ONDANSETRON HCL 4 MG PO TABS: 4.0000 mg | ORAL_TABLET | Freq: Four times a day (QID) | ORAL | 0 refills | Status: DC

## 2021-06-15 MED ORDER — LACTATED RINGERS IV SOLN: INTRAVENOUS | Status: DC

## 2021-06-15 MED ORDER — OXYCODONE-ACETAMINOPHEN 5-325 MG PO TABS
1.0000 | ORAL_TABLET | Freq: Four times a day (QID) | ORAL | 0 refills | Status: DC | PRN
Start: 2021-06-15 — End: 2021-06-22

## 2021-06-15 NOTE — Discharge Instructions (Signed)
Based on the ultrasound it appears that you have a very large cyst in your liver and it looks like it has not bled today.  That is most likely the cause of the sudden pain you have experienced.  However it is very important that you get an MRI with and without contrast in the future to further evaluate the cyst.  In the meantime use the pain medication as needed and nausea medicine.  If you start having fever, severe pain and nausea and vomiting return to the emergency room.

## 2021-06-15 NOTE — ED Triage Notes (Signed)
Pt states she woke up with R sided rib pain and R clavicle pain after sleeping on the couch last night. Denies known trauma. Pt also endorsing N/V.

## 2021-06-15 NOTE — ED Provider Notes (Signed)
[facility] EMERGENCY DEPT Provider Note   CSN: [id number] Arrival date & time: 06/15/21  1607     History Chief Complaint  Patient presents with   Rib Injury    Doniqua Saxby is a 50 y.o. female.  Patient is a 50 year old female with a history of hypertension who is presenting today with complaints of right-sided rib/upper abdominal pain.  Patient reports yesterday was a normal day and she felt fine.  She has been sleeping on the couch to take care of her mother because they did not have caregivers for the last few days and around midnight when she got up to check on her she developed a more sudden type of pain that started in the right lower ribs and right upper quadrant.  She reports the pain is sharp stabbing and severe.  It radiates up into her right collarbone.  It is pleuritic in nature and also worse with any type of movement or pushing on her abdomen.  She has had 1 episode of vomiting which she feels is related to the pain.  She denies any change in her urine or bowel habits.  She has not had cough, congestion or fever.  She denies any flulike symptoms.  She does not use tobacco products and denies any prior history of abdominal surgeries.  She has not had any recent immobilization and denies any unilateral leg pain or swelling but does admit to taking estrogen replacement.   The history is provided by the patient.      Past Medical History:  Diagnosis Date   Hypertension     There are no problems to display for this patient.   Past Surgical History:  Procedure Laterality Date   KNEE ARTHROSCOPY     Meniscus     OB History   No obstetric history on file.     No family history on file.     Home Medications Prior to Admission medications   Not on File    Allergies    Novocain [procaine]  Review of Systems   Review of Systems  All other systems reviewed and are negative.  Physical Exam Updated Vital Signs BP (!) 141/80    Pulse 89     Temp 98 F (36.7 C) (Oral)    Resp 18    Ht 5\' 3"  (1.6 m)    Wt 68 kg    SpO2 92%    BMI 26.57 kg/m   Physical Exam Vitals and nursing note reviewed.  Constitutional:      General: She is not in acute distress.    Appearance: She is well-developed.     Comments: Appears uncomfortable  HENT:     Head: Normocephalic and atraumatic.  Eyes:     Pupils: Pupils are equal, round, and reactive to light.  Cardiovascular:     Rate and Rhythm: Normal rate and regular rhythm.     Heart sounds: Normal heart sounds. No murmur heard.   No friction rub.  Pulmonary:     Effort: Pulmonary effort is normal.     Breath sounds: Normal breath sounds. No wheezing or rales.  Chest:     Chest wall: No tenderness.  Abdominal:     General: Bowel sounds are normal. There is no distension.     Palpations: Abdomen is soft.     Tenderness: There is abdominal tenderness in the right upper quadrant. There is guarding. There is no rebound. Positive signs include Murphy's sign.  Musculoskeletal:  General: No tenderness. Normal range of motion.     Cervical back: Normal range of motion and neck supple.     Right lower leg: No edema.     Comments: No edema  Skin:    General: Skin is warm and dry.     Findings: No rash.  Neurological:     Mental Status: She is alert and oriented to person, place, and time. Mental status is at baseline.     Cranial Nerves: No cranial nerve deficit.  Psychiatric:        Mood and Affect: Mood normal.        Behavior: Behavior normal.    ED Results / Procedures / Treatments   Labs (all labs ordered are listed, but only abnormal results are displayed) Labs Reviewed  CBC WITH DIFFERENTIAL/PLATELET - Abnormal; Notable for the following components:      Result Value   WBC 13.5 (*)    Neutro Abs 12.1 (*)    Lymphs Abs 0.6 (*)    All other components within normal limits  COMPREHENSIVE METABOLIC PANEL - Abnormal; Notable for the following components:   Glucose, Bld 148  (*)    All other components within normal limits  LIPASE, BLOOD - Abnormal; Notable for the following components:   Lipase <10 (*)    All other components within normal limits  URINALYSIS, ROUTINE W REFLEX MICROSCOPIC - Abnormal; Notable for the following components:   Specific Gravity, Urine >1.046 (*)    Hgb urine dipstick MODERATE (*)    Protein, ur 30 (*)    Nitrite POSITIVE (*)    Bacteria, UA MANY (*)    All other components within normal limits  D-DIMER, QUANTITATIVE - Abnormal; Notable for the following components:   D-Dimer, Quant 0.58 (*)    All other components within normal limits    EKG EKG Interpretation  Date/Time:  Monday June 15 2021 08:28:34 EST Ventricular Rate:  91 PR Interval:  176 QRS Duration: 86 QT Interval:  374 QTC Calculation: 461 R Axis:   -43 Text Interpretation: Sinus rhythm Anterior infarct, old No previous tracing Confirmed by Gwyneth Sprout ([id number]) on 06/15/2021 8:50:23 AM  Radiology CT Angio Chest PE W and/or Wo Contrast  Result Date: 06/15/2021 CLINICAL DATA:  Pulmonary embolism suspected. Positive D-dimer. Right-sided pain. EXAM: CT ANGIOGRAPHY CHEST WITH CONTRAST TECHNIQUE: Multidetector CT imaging of the chest was performed using the standard protocol during bolus administration of intravenous contrast. Multiplanar CT image reconstructions and MIPs were obtained to evaluate the vascular anatomy. CONTRAST:  39mL OMNIPAQUE IOHEXOL 350 MG/ML SOLN COMPARISON:  Chest radiography same day FINDINGS: Cardiovascular: Borderline cardiomegaly. Small amount of pericardial fluid in the superior recess. No coronary artery calcification. Mild ectasia of the aorta. Maximal diameter of the ascending aorta is 3.4 cm. Pulmonary arterial opacification is good. There are no pulmonary emboli. Mediastinum/Nodes: No mediastinal or hilar mass or adenopathy. Lungs/Pleura: Small right pleural effusion layering dependently. Patchy infiltrate in both lower lobes, more  extensive on the right than the left, consistent with bronchopneumonia. No lobar consolidation or collapse. Upper lobes are clear. Upper Abdomen: Massive cyst within the right lobe of the liver measuring up to 14 cm in diameter. Adjacent cyst at the inferior margin of the larger cyst measuring up to 5 cm in diameter. Several other scattered small cysts within the liver. Musculoskeletal: Normal Review of the MIP images confirms the above findings. IMPRESSION: No pulmonary emboli. Small pleural effusion on the right. Patchy infiltrate and volume loss  in both lower lobes consistent with bronchopneumonia. No dense consolidation or lobar collapse. Mild ectasia of the aorta but without evidence of frank aneurysm or atherosclerotic calcification. Maximal diameter of the ascending aorta is 3.4 cm. No follow-up imaging recommended at this point. Multiple liver cysts, including a massive cyst in the right lobe measuring up to 14 cm in diameter. Electronically Signed   By: Paulina Fusi M.D.   On: 06/15/2021 10:02   DG Chest Port 1 View  Result Date: 06/15/2021 CLINICAL DATA:  Right rib pain with no injury. EXAM: PORTABLE CHEST 1 VIEW COMPARISON:  None. FINDINGS: Numerous leads and wires project over the chest. Midline trachea. Normal heart size for level of inspiration. Mild to moderate right hemidiaphragm elevation. No pleural effusion or pneumothorax. Left base airspace disease. Minimal medial right lung base volume loss and atelectasis. IMPRESSION: Left base airspace disease, suspicious for pneumonia. Correlate with infectious symptoms, given clinical history of right rib pain. Followup PA and lateral chest X-ray is recommended in 3-4 weeks following trial of antibiotic therapy to ensure resolution and exclude underlying malignancy. In addition, comparison with prior radiographs would be informative if available. Electronically Signed   By: Jeronimo Greaves M.D.   On: 06/15/2021 08:45   US Abdomen Limited RUQ  (LIVER/GB)  Result Date: 06/15/2021 CLINICAL DATA:  Right upper quadrant pain. Large cyst seen on chest CT EXAM: ULTRASOUND ABDOMEN LIMITED RIGHT UPPER QUADRANT COMPARISON:  CT chest 06/15/2021 FINDINGS: Gallbladder: No gallstones or wall thickening visualized. No sonographic Murphy sign noted by sonographer. Common bile duct: Diameter: 5 mm. Liver: Large complex cyst at the right hepatic lobe measuring approximately 17.2 x 12.3 x 16.3 cm containing multiple thickened, irregular internal septations, some of which demonstrate vascularity on color Doppler. Simple ovoid 4.6 cm cyst within the anterior aspect of the left hepatic lobe. Simple 1.2 cm cyst within the left hepatic lobe. Within normal limits in parenchymal echogenicity. Portal vein is patent on color Doppler imaging with normal direction of blood flow towards the liver. Other: None. IMPRESSION: 1. Large complex right hepatic lobe cyst measuring up to 17.2 cm with multiple thickened, irregular internal septations. Differential includes complex hemorrhagic or infected hepatic cyst versus cystic hepatic neoplasm such as biliary cystadenoma or cystadenocarcinoma. Further evaluation with hepatic protocol MRI without and with IV contrast is recommended. 2. Additional smaller cysts within the liver, which appears simple. Electronically Signed   By: Duanne Guess D.O.   On: 06/15/2021 12:04    Procedures Procedures   Medications Ordered in ED Medications  lactated ringers infusion ( Intravenous New Bag/Given 06/15/21 0848)  ondansetron (ZOFRAN) injection 4 mg (4 mg Intravenous Given 06/15/21 0843)  morphine 4 MG/ML injection 4 mg (4 mg Intravenous Given 06/15/21 0844)  iohexol (OMNIPAQUE) 350 MG/ML injection 75 mL (75 mLs Intravenous Contrast Given 06/15/21 0940)  morphine 4 MG/ML injection 4 mg (4 mg Intravenous Given 06/15/21 1145)  oxyCODONE-acetaminophen (PERCOCET/ROXICET) 5-325 MG per tablet 1 tablet (1 tablet Oral Given 06/15/21 1247)     ED Course  I have reviewed the triage vital signs and the nursing notes.  Pertinent labs & imaging results that were available during my care of the patient were reviewed by me and considered in my medical decision making (see chart for details).    MDM Rules/Calculators/A&P                         Patient is a 50 year old female presenting today with right lower  rib and right upper quadrant pain.  She does have some guarding and Murphy sign on exam but also has an increased risk for blood clots as she is on estrogen replacement.  Currently she has oxygen saturation in the 90s, heart rate in the 90s.  Blood pressure is stable.  She denies any bowel or bladder issues and lower concern for kidney stone at this time.  EKG without acute findings and low suspicion for ACS.  No rashes noted on the chest concerning for shingles.  Concern for cholelithiasis versus PE.  Low suspicion for acute infectious etiology.  Labs are pending and patient given pain control.  12:58 PM I ordered and interpreted the x-ray and chest x-ray via my interpretation without acute findings.  Radiology felt that there could be possible infiltrate on the left.  Patient's D-dimer is elevated but LFTs and lipase within normal limits.  CBC with mild leukocytosis of 13,000 today.  CTA of the chest was negative for PE today but showed possible bilateral bronchopneumonia slightly worse on the right.  Unclear if this could possibly be atelectasis as patient has not had a cough or infectious symptoms.  Initially morphine was helpful for the pain but it is now starting to come back.  We will do an ultrasound to ensure this is not gallbladder pathology.  12:58 PM Patient's ultrasound shows a large complex right hepatic lobe cyst measuring 17 cm with multiple thickened irregular internal septations which could be a complex hemorrhagic hepatic cyst versus a cystic hepatic neoplasm.  Given the patient's abrupt onset of pain today suspect  hemorrhagic cyst.  However did discuss that the patient will need hepatic protocol MRI in the future with and without contrast.  At this time we will continue pain control.  She will call her doctor tomorrow for follow-up.  MDM   Amount and/or Complexity of Data Reviewed Clinical lab tests: ordered and reviewed Tests in the radiology section of CPT: ordered and reviewed Tests in the medicine section of CPT: reviewed and ordered Independent visualization of images, tracings, or specimens: yes  Patient Progress Patient progress: stable        Final Clinical Impression(s) / ED Diagnoses Final diagnoses:  RUQ abdominal pain  Hepatic cyst    Rx / DC Orders ED Discharge Orders          Ordered    oxyCODONE-acetaminophen (PERCOCET/ROXICET) 5-325 MG tablet  Every 6 hours PRN        06/15/21 1300    ondansetron (ZOFRAN) 4 MG tablet  Every 6 hours        06/15/21 1301             Gwyneth Sprout, MD 06/15/21 1302

## 2021-06-17 ENCOUNTER — Other Ambulatory Visit: Payer: Self-pay

## 2021-06-17 ENCOUNTER — Inpatient Hospital Stay (HOSPITAL_COMMUNITY)
Admission: EM | Admit: 2021-06-17 | Discharge: 2021-06-22 | DRG: 443 | Disposition: A | Discharge status: Home / Self Care | Payer: 59 | Attending: Internal Medicine | Admitting: Internal Medicine

## 2021-06-17 ENCOUNTER — Encounter (HOSPITAL_COMMUNITY): Payer: Self-pay | Admitting: Emergency Medicine

## 2021-06-17 ENCOUNTER — Emergency Department (HOSPITAL_COMMUNITY): Payer: 59

## 2021-06-17 DIAGNOSIS — F32A Depression, unspecified: Secondary | ICD-10-CM | POA: Diagnosis present

## 2021-06-17 DIAGNOSIS — Z20822 Contact with and (suspected) exposure to covid-19: Secondary | ICD-10-CM | POA: Diagnosis present

## 2021-06-17 DIAGNOSIS — Z780 Asymptomatic menopausal state: Secondary | ICD-10-CM | POA: Diagnosis not present

## 2021-06-17 DIAGNOSIS — Z79899 Other long term (current) drug therapy: Secondary | ICD-10-CM

## 2021-06-17 DIAGNOSIS — E876 Hypokalemia: Secondary | ICD-10-CM | POA: Diagnosis present

## 2021-06-17 DIAGNOSIS — K5900 Constipation, unspecified: Secondary | ICD-10-CM | POA: Diagnosis present

## 2021-06-17 DIAGNOSIS — Z79890 Hormone replacement therapy (postmenopausal): Secondary | ICD-10-CM

## 2021-06-17 DIAGNOSIS — Z886 Allergy status to analgesic agent status: Secondary | ICD-10-CM

## 2021-06-17 DIAGNOSIS — J302 Other seasonal allergic rhinitis: Secondary | ICD-10-CM | POA: Diagnosis present

## 2021-06-17 DIAGNOSIS — K7689 Other specified diseases of liver: Principal | ICD-10-CM | POA: Diagnosis present

## 2021-06-17 DIAGNOSIS — R1011 Right upper quadrant pain: Secondary | ICD-10-CM

## 2021-06-17 DIAGNOSIS — I10 Essential (primary) hypertension: Secondary | ICD-10-CM | POA: Diagnosis present

## 2021-06-17 LAB — CBC
HCT: 38.9 % (ref 36.0–46.0)
Hemoglobin: 13 g/dL (ref 12.0–15.0)
MCH: 29 pg (ref 26.0–34.0)
MCHC: 33.4 g/dL (ref 30.0–36.0)
MCV: 86.6 fL (ref 80.0–100.0)
Platelets: 230 10*3/uL (ref 150–400)
RBC: 4.49 MIL/uL (ref 3.87–5.11)
RDW: 12.2 % (ref 11.5–15.5)
WBC: 8.1 10*3/uL (ref 4.0–10.5)
nRBC: 0 % (ref 0.0–0.2)

## 2021-06-17 LAB — COMPREHENSIVE METABOLIC PANEL
ALT: 27 U/L (ref 0–44)
AST: 20 U/L (ref 15–41)
Albumin: 4 g/dL (ref 3.5–5.0)
Alkaline Phosphatase: 79 U/L (ref 38–126)
Anion gap: 10 (ref 5–15)
BUN: 11 mg/dL (ref 6–20)
CO2: 25 mmol/L (ref 22–32)
Calcium: 8.9 mg/dL (ref 8.9–10.3)
Chloride: 103 mmol/L (ref 98–111)
Creatinine, Ser: 0.7 mg/dL (ref 0.44–1.00)
GFR, Estimated: >60 mL/min (ref >60)
Glucose, Bld: 100 mg/dL — ABNORMAL HIGH (ref 70–99)
Potassium: 3.4 mmol/L — ABNORMAL LOW (ref 3.5–5.1)
Sodium: 138 mmol/L (ref 135–145)
Total Bilirubin: 0.7 mg/dL (ref 0.3–1.2)
Total Protein: 7.3 g/dL (ref 6.5–8.1)

## 2021-06-17 LAB — PROCALCITONIN: Procalcitonin: <0.1 ng/mL

## 2021-06-17 LAB — LIPASE, BLOOD: Lipase: 23 U/L (ref 11–51)

## 2021-06-17 LAB — RESP PANEL BY RT-PCR (FLU A&B, COVID) ARPGX2
Influenza A by PCR: NEGATIVE
Influenza B by PCR: NEGATIVE
SARS Coronavirus 2 by RT PCR: NEGATIVE

## 2021-06-17 MED ORDER — HYDROMORPHONE HCL 1 MG/ML IJ SOLN
1.0000 mg | Freq: Once | INTRAMUSCULAR | Status: AC
  Administered 2021-06-17: 1 mg via INTRAVENOUS
  Filled 2021-06-17: qty 1

## 2021-06-17 MED ORDER — ONDANSETRON HCL 4 MG/2ML IJ SOLN: 4.0000 mg | Freq: Four times a day (QID) | INTRAMUSCULAR | Status: DC | PRN

## 2021-06-17 MED ORDER — NORELGESTROMIN-ETH ESTRADIOL 150-35 MCG/24HR TD PTWK
1.0000 | MEDICATED_PATCH | TRANSDERMAL | Status: DC
Start: 2021-06-18 — End: 2021-06-22
  Administered 2021-06-18: 1 via TRANSDERMAL

## 2021-06-17 MED ORDER — ACETAMINOPHEN 325 MG PO TABS
650.0000 mg | ORAL_TABLET | Freq: Four times a day (QID) | ORAL | Status: DC | PRN
  Administered 2021-06-18 – 2021-06-19 (×2): 650 mg via ORAL
  Filled 2021-06-17 (×4): qty 2

## 2021-06-17 MED ORDER — AZELASTINE-FLUTICASONE 137-50 MCG/ACT NA SUSP
1.0000 | Freq: Two times a day (BID) | NASAL | Status: DC
Start: 2021-06-17 — End: 2021-06-22
  Administered 2021-06-18 – 2021-06-22 (×6): 1 via NASAL

## 2021-06-17 MED ORDER — LACTATED RINGERS IV BOLUS
1000.0000 mL | Freq: Once | INTRAVENOUS | Status: AC
  Administered 2021-06-17: 1000 mL via INTRAVENOUS

## 2021-06-17 MED ORDER — MONTELUKAST SODIUM 10 MG PO TABS
10.0000 mg | ORAL_TABLET | Freq: Every evening | ORAL | Status: DC
  Administered 2021-06-18 – 2021-06-21 (×4): 10 mg via ORAL
  Filled 2021-06-17 (×5): qty 1

## 2021-06-17 MED ORDER — LORATADINE 10 MG PO TABS
10.0000 mg | ORAL_TABLET | Freq: Every evening | ORAL | Status: DC
  Administered 2021-06-18 – 2021-06-21 (×4): 10 mg via ORAL
  Filled 2021-06-17 (×4): qty 1

## 2021-06-17 MED ORDER — ALBUTEROL SULFATE (2.5 MG/3ML) 0.083% IN NEBU: 2.5000 mg | INHALATION_SOLUTION | RESPIRATORY_TRACT | Status: DC | PRN

## 2021-06-17 MED ORDER — GADOBUTROL 1 MMOL/ML IV SOLN
6.0000 mL | Freq: Once | INTRAVENOUS | Status: AC | PRN
  Administered 2021-06-17: 6 mL via INTRAVENOUS

## 2021-06-17 MED ORDER — SENNOSIDES-DOCUSATE SODIUM 8.6-50 MG PO TABS: 1.0000 | ORAL_TABLET | Freq: Every evening | ORAL | Status: DC | PRN

## 2021-06-17 MED ORDER — HYDROMORPHONE HCL 1 MG/ML IJ SOLN
0.5000 mg | INTRAMUSCULAR | Status: DC | PRN
  Administered 2021-06-17 – 2021-06-18 (×4): 0.5 mg via INTRAVENOUS
  Filled 2021-06-17: qty 0.5
  Filled 2021-06-17: qty 1
  Filled 2021-06-17 (×3): qty 0.5

## 2021-06-17 MED ORDER — ADULT MULTIVITAMIN W/MINERALS CH
1.0000 | ORAL_TABLET | Freq: Every day | ORAL | Status: DC
  Administered 2021-06-19 – 2021-06-22 (×4): 1 via ORAL
  Filled 2021-06-17 (×4): qty 1

## 2021-06-17 MED ORDER — ESCITALOPRAM OXALATE 20 MG PO TABS
20.0000 mg | ORAL_TABLET | Freq: Every evening | ORAL | Status: DC
  Administered 2021-06-18 – 2021-06-21 (×4): 20 mg via ORAL
  Filled 2021-06-17 (×4): qty 1

## 2021-06-17 MED ORDER — ACETAMINOPHEN 650 MG RE SUPP: 650.0000 mg | Freq: Four times a day (QID) | RECTAL | Status: DC | PRN

## 2021-06-17 MED ORDER — POTASSIUM CHLORIDE IN NACL 20-0.9 MEQ/L-% IV SOLN
INTRAVENOUS | Status: DC
  Filled 2021-06-17 (×5): qty 1000

## 2021-06-17 MED ORDER — LEVOCETIRIZINE DIHYDROCHLORIDE 5 MG PO TABS: 5.0000 mg | ORAL_TABLET | Freq: Every evening | ORAL | Status: DC

## 2021-06-17 MED ORDER — LISINOPRIL 10 MG PO TABS
10.0000 mg | ORAL_TABLET | Freq: Every evening | ORAL | Status: DC
  Administered 2021-06-18 – 2021-06-21 (×4): 10 mg via ORAL
  Filled 2021-06-17 (×4): qty 1

## 2021-06-17 MED ORDER — ONDANSETRON HCL 4 MG/2ML IJ SOLN
4.0000 mg | Freq: Once | INTRAMUSCULAR | Status: AC
  Administered 2021-06-17: 4 mg via INTRAVENOUS
  Filled 2021-06-17: qty 2

## 2021-06-17 MED ORDER — OXYCODONE HCL 5 MG PO TABS
5.0000 mg | ORAL_TABLET | ORAL | Status: DC | PRN
  Administered 2021-06-17 – 2021-06-22 (×15): 5 mg via ORAL
  Filled 2021-06-17 (×16): qty 1

## 2021-06-17 MED ORDER — ONDANSETRON HCL 4 MG PO TABS: 4.0000 mg | ORAL_TABLET | Freq: Four times a day (QID) | ORAL | Status: DC | PRN

## 2021-06-17 NOTE — ED Notes (Signed)
Patient transported to MRI 

## 2021-06-17 NOTE — ED Notes (Signed)
No purple man visible to ED, chatted floor RN, Diona Browner, for report.

## 2021-06-17 NOTE — ED Triage Notes (Signed)
States she was seen at [facility] Monday and dx w/ a cyst on her liver, has been in pain since visit w/ N/V. Denies diarrhea. Symptoms started 12/25 evening.

## 2021-06-17 NOTE — ED Provider Notes (Signed)
[facility]-EMERGENCY DEPT Provider Note   CSN: [id number] Arrival date & time: 06/17/21  1191     History Chief Complaint  Patient presents with   Abdominal Pain    Megan Wilkins is a 50 y.o. female.  Patient is a 50 year old female with a history of hypertension.  Returning to the emergency room today due to worsening right upper quadrant abdominal pain.  Patient reports the pain started suddenly on Monday and has been progressive.  She was seen at the emergency room on Monday and at that time found to have a large what appeared to be hemorrhagic cyst in her liver.  Her lab work was normal and at that time she was given pain medication.  Unfortunately since going home she has had persistent pain which is worse with any movement or taking a deep breath.  She has been taking pain medication at home but reports it is not getting any better.  If anything it is gotten worse.  She is also not had a bowel movement since the pain started.  She denies any urinary symptoms.  She has been eating and drinking very little because she feels that it makes it hurt worse.  She denies any cough, chest pain or shortness of breath except that taking a deep breath makes the pain worse.  The history is provided by the patient.  Abdominal Pain     Past Medical History:  Diagnosis Date   Hypertension     There are no problems to display for this patient.   Past Surgical History:  Procedure Laterality Date   KNEE ARTHROSCOPY     Meniscus     OB History   No obstetric history on file.     No family history on file.     Home Medications Prior to Admission medications   Medication Sig Start Date End Date Taking? Authorizing Provider  ondansetron (ZOFRAN) 4 MG tablet Take 1 tablet (4 mg total) by mouth every 6 (six) hours. 06/15/21   Gwyneth Sprout, MD  oxyCODONE-acetaminophen (PERCOCET/ROXICET) 5-325 MG tablet Take 1 tablet by mouth every 6 (six) hours as needed for  severe pain. 06/15/21   Gwyneth Sprout, MD    Allergies    Novocain [procaine]  Review of Systems   Review of Systems  Gastrointestinal:  Positive for abdominal pain.  All other systems reviewed and are negative.  Physical Exam Updated Vital Signs BP (!) 162/75 (BP Location: Right Arm)    Pulse 73    Temp 98.2 F (36.8 C) (Oral)    Resp 18    SpO2 95%   Physical Exam Vitals and nursing note reviewed.  Constitutional:      General: She is not in acute distress.    Appearance: She is well-developed.     Comments: Appears uncomfortable  HENT:     Head: Normocephalic and atraumatic.  Eyes:     Conjunctiva/sclera: Conjunctivae normal.     Pupils: Pupils are equal, round, and reactive to light.  Cardiovascular:     Rate and Rhythm: Normal rate and regular rhythm.     Heart sounds: No murmur heard. Pulmonary:     Effort: Pulmonary effort is normal. No respiratory distress.     Breath sounds: Normal breath sounds. No wheezing or rales.  Abdominal:     General: There is no distension.     Palpations: Abdomen is soft.     Tenderness: There is abdominal tenderness in the right upper quadrant. There is  guarding and rebound.  Musculoskeletal:        General: No tenderness. Normal range of motion.     Cervical back: Normal range of motion and neck supple.     Right lower leg: No edema.     Left lower leg: No edema.  Skin:    General: Skin is warm and dry.     Findings: No erythema or rash.  Neurological:     Mental Status: She is alert and oriented to person, place, and time. Mental status is at baseline.  Psychiatric:        Mood and Affect: Mood normal.        Behavior: Behavior normal.    ED Results / Procedures / Treatments   Labs (all labs ordered are listed, but only abnormal results are displayed) Labs Reviewed  COMPREHENSIVE METABOLIC PANEL - Abnormal; Notable for the following components:      Result Value   Potassium 3.4 (*)    Glucose, Bld 100 (*)    All  other components within normal limits  RESP PANEL BY RT-PCR (FLU A&B, COVID) ARPGX2  LIPASE, BLOOD  CBC    EKG None  Radiology MR Abdomen W or Wo Contrast  Result Date: 06/17/2021 CLINICAL DATA:  Complex cystic liver lesion on recent ultrasound. EXAM: MRI ABDOMEN WITHOUT AND WITH CONTRAST TECHNIQUE: Multiplanar multisequence MR imaging of the abdomen was performed both before and after the administration of intravenous contrast. CONTRAST:  66mL GADAVIST GADOBUTROL 1 MMOL/ML IV SOLN COMPARISON:  None. FINDINGS: Lower chest: Small right and tiny left pleural effusions are seen. Bibasilar atelectasis is noted. Hepatobiliary: Several simple cysts are seen in both the right and left hepatic lobes, largest in the anterior right lobe measuring 5 cm. A large complex cystic lesion is seen involving the anterior and posterior segments of the right lobe, which measures 13.2 x 12.9 cm. This lesion shows T1 hyperintense fluid and multiple thin irregular internal septations, some showing mild contrast enhancement on subtraction imaging. No solid nodular components identified. No evidence of edema in adjacent hepatic parenchyma. No solid hepatic masses are identified. Gallbladder is unremarkable. No evidence of biliary ductal dilatation. Pancreas:  No mass or inflammatory changes. Spleen:  Within normal limits in size and appearance. Adrenals/Urinary Tract: No masses identified. No evidence of hydronephrosis. Stomach/Bowel: Visualized portion unremarkable. Vascular/Lymphatic: No pathologically enlarged lymph nodes identified. No acute vascular findings. Other:  None. Musculoskeletal:  No suspicious bone lesions identified. IMPRESSION: 13.2 cm complex cystic lesion in right hepatic lobe, with nonspecific but probably benign characteristics. Abscess is considered unlikely, however biliary cystadenoma cannot be excluded. Small bilateral pleural effusions and bibasilar atelectasis. Electronically Signed   By: Danae Orleans  M.D.   On: 06/17/2021 13:43    Procedures Procedures   Medications Ordered in ED Medications  HYDROmorphone (DILAUDID) injection 1 mg (has no administration in time range)  ondansetron (ZOFRAN) injection 4 mg (has no administration in time range)  lactated ringers bolus 1,000 mL (has no administration in time range)    ED Course  I have reviewed the triage vital signs and the nursing notes.  Pertinent labs & imaging results that were available during my care of the patient were reviewed by me and considered in my medical decision making (see chart for details).    MDM Rules/Calculators/A&P                         Patient returning today due to persistent right  upper quadrant pain.  I saw the patient on Monday for similar symptoms and that time she was found to have a very large what appeared to be hemorrhagic hepatic cyst.  She however continues to have pain and discomfort.  It was recommended that she have an MRI hepatic protocol with and without contrast for further evaluation to ensure this was not concerning for a cancerous lesion.  She does not take any anticoagulation.  She has been taking the pain medication at home but it is not helping.  At this time vital signs remained stable.  Hemoglobin remained stable at 13.  We will get MRI for further evaluation.  Patient given pain control.  2:07 PM MRI shows a 13.2 cm complex cystic lesion in the right hepatic lobe with nonspecific but probably benign characteristics.  Cannot exclude a biliary cystadenoma.  Patient does have family history of her aunt who had an abdominal cancer but nobody else.  She is still having extreme pain and requiring IV Dilaudid.  Given patient has failed outpatient treatment with pain control will admit for further care.  Unclear if patient would benefit from IR drainage but most likely needs biopsy.  MDM   Amount and/or Complexity of Data Reviewed Clinical lab tests: ordered and reviewed Tests in the  radiology section of CPT: reviewed and ordered Independent visualization of images, tracings, or specimens: yes        Final Clinical Impression(s) / ED Diagnoses Final diagnoses:  Right upper quadrant abdominal pain  Hepatic cyst    Rx / DC Orders ED Discharge Orders     None        Gwyneth Sprout, MD 06/17/21 1409

## 2021-06-17 NOTE — H&P (Signed)
History and Physical    Megan Wilkins UXL:[id number] DOB: 1971-01-26 DOA: 06/17/2021  PCP: Sigmund Hazel, MD   Patient coming from: Home  Chief Complaint  Patient presents with   Abdominal Pain      HPI: Megan Wilkins is a 50 y.o. female with history of hypertension presents to the ED for evaluation of worsening right upper quadrant abdominal pain.  Pain is started suddenly on Monday, and has been progressive, was seen in the ED on Monday found to have large what appeared to be hemorrhagic cyst in her liver lab work showed normal and patient was discharged with pain medication however pain continued to get worse so she came to the ED for further evaluation. She vomited on Monday and since her discharge from [facility] ED no more vomiting but burping, and has been taking oxycodone  and zofran.  In the ED, blood pressure fairly stable slightly higher side, afebrile, labs showing mild hypokalemia otherwise stable CBC CMP and normal lipase.  Underwent MRI of the abdomen that showed 13.2 complex cystic lesion in the right hepatic lobe probably benign characteristic, abscess is considered unlikely however biliary cystadenoma cannot be excluded.  Patient received IV Dilaudid IV fluid bolus and Zofran for pain control and count to have abdominal pain, GI and surgery was consulted GI advised to consult with surgery for possible resection given significant pain.  Dr. Freida Busman from surgery was consulted and he advised IR to drain it if has recurrence surgery then can be involved.  IR was consulted, plan for drainage with n.p.o. past midnight  Vitals:   06/17/21 1630 06/17/21 1645 06/17/21 1700 06/17/21 1715  BP: 137/89 (!) 142/79 (!) 148/76 (!) 162/82  Pulse: 61 (!) 58 61 65  Resp: 15 18 14 17   Temp:      TempSrc:      SpO2: 91% 93% 92% 97%     Assessment/Plan  Right upper quadrant abdominal pain 13.2 cm complex cystic lesion right hepatic lobe: Patient with uncontrolled pain worsening, onset Monday  and imaging shows large right hepatic lobe cystic lesion, GI and surgery has been consulted recommended IR consult for drainage.  Patient is n.p.o. past midnight we will order cytology as well as fluid analysis Continue pain control with IV Dilaudid, gentle IVF hydration, nausea medication.  Hypertension blood pressure borderline controlled.  Resume lisinopril  Mild hypokalemia replete wi ivf.  Allergy history-stable now. Allergic to  anesthesia with -caine family but never had anaphylactic reaction  Severity of Illness: The appropriate patient status for this patient is INPATIENT. Inpatient status is judged to be reasonable and necessary in order to provide the required intensity of service to ensure the patient's safety. The patient's presenting symptoms, physical exam findings, and initial radiographic and laboratory data in the context of their chronic comorbidities is felt to place them at high risk for further clinical deterioration. Furthermore, it is not anticipated that the patient will be medically stable for discharge from the hospital within 2 midnights of admission.   * I certify that at the point of admission it is my clinical judgment that the patient will require inpatient hospital care spanning beyond 2 midnights from the point of admission due to high intensity of service, high risk for further deterioration and high frequency of surveillance required.*   DVT prophylaxis: SCDs Start: 06/17/21 1716 Code Status:   Code Status: Full Code  Family Communication: Admission, patients condition and plan of care including tests being ordered have been discussed with the patient  who indicate understanding and agree with the plan and Code Status.  Consults called:  GI, general surgery  Review of Systems: All systems were reviewed and were negative except as mentioned in HPI above. Negative for fever Negative for chest pain Negative for shortness of breath  Past Medical History:   Diagnosis Date   Hypertension     Past Surgical History:  Procedure Laterality Date   KNEE ARTHROSCOPY     Meniscus     has no history on file for tobacco use, alcohol use, and drug use.  Allergies  Allergen Reactions   Aspercreme [Trolamine Salicylate] Swelling   Novocain [Procaine] Swelling    No family history on file.   Prior to Admission medications   Medication Sig Start Date End Date Taking? Authorizing Provider  acetaminophen (TYLENOL) 500 MG tablet Take 1,000 mg by mouth every 6 (six) hours as needed for mild pain.   Yes [provider]  Azelastine-Fluticasone 137-50 MCG/ACT SUSP Place 1 spray into both nostrils 2 (two) times daily. 01/07/21  Yes [provider]  escitalopram (LEXAPRO) 20 MG tablet Take 20 mg by mouth every evening. 08/20/20  Yes [provider]  Ferrous Sulfate (SLOW FE PO) Take 1 tablet by mouth daily.   Yes [provider]  levocetirizine (XYZAL) 5 MG tablet Take 5 mg by mouth every evening.   Yes [provider]  lisinopril (ZESTRIL) 10 MG tablet Take 10 mg by mouth every evening. 04/17/21  Yes [provider]  montelukast (SINGULAIR) 10 MG tablet Take 10 mg by mouth every evening. 05/01/21  Yes [provider]  Multiple Vitamin (MULTIVITAMIN) tablet Take 1 tablet by mouth daily.   Yes [provider]  ondansetron (ZOFRAN) 4 MG tablet Take 1 tablet (4 mg total) by mouth every 6 (six) hours. Patient taking differently: Take 4 mg by mouth every 6 (six) hours as needed for nausea or vomiting. 06/15/21  Yes Gwyneth Sprout, MD  oxyCODONE-acetaminophen (PERCOCET/ROXICET) 5-325 MG tablet Take 1 tablet by mouth every 6 (six) hours as needed for severe pain. 06/15/21  Yes Gwyneth Sprout, MD  PROAIR RESPICLICK 108 (90 Base) MCG/ACT AEPB Inhale 1 puff into the lungs every 6 (six) hours as needed for wheezing or shortness of breath. 01/07/21  Yes [provider]  ZAFEMY 150-35  MCG/24HR transdermal patch Place 1 patch onto the skin once a week. 06/04/21  Yes [provider]    Physical Exam: Vitals:   06/17/21 1630 06/17/21 1645 06/17/21 1700 06/17/21 1715  BP: 137/89 (!) 142/79 (!) 148/76 (!) 162/82  Pulse: 61 (!) 58 61 65  Resp: 15 18 14 17   Temp:      TempSrc:      SpO2: 91% 93% 92% 97%    General exam: AAOx3 , pleasant, not in distress  HEENT:Oral mucosa moist, Ear/Nose WNL grossly, dentition normal. Respiratory system: bilaterally clear diminished at the base,no use of accessory muscle Cardiovascular system:S1 & S2 +,No JVD,. Gastrointestinal system: Abdomen soft, tender right upper quadrant,ND, BS+ Nervous System:Alert, awake, moving extremities and grossly nonfocal Extremities: No edema, distal peripheral pulses palpable.  Skin: No rashes,no icterus. MSK: Normal muscle bulk,tone, power   Labs on Admission: I have personally reviewed following labs and imaging studies  CBC: Recent Labs  Lab 06/15/21 0841 06/17/21 1046  WBC 13.5* 8.1  NEUTROABS 12.1*  --   HGB 13.4 13.0  HCT 39.7 38.9  MCV 85.2 86.6  PLT 222 230   Basic Metabolic Panel: Recent  Labs  Lab 06/15/21 0841 06/17/21 1046  NA 136 138  K 3.5 3.4*  CL 103 103  CO2 23 25  GLUCOSE 148* 100*  BUN 10 11  CREATININE 0.53 0.70  CALCIUM 8.9 8.9   GFR: Estimated Creatinine Clearance: 77 mL/min (by C-G formula based on SCr of 0.7 mg/dL). Liver Function Tests: Recent Labs  Lab 06/15/21 0841 06/17/21 1046  AST 21 20  ALT 33 27  ALKPHOS 94 79  BILITOT 0.6 0.7  PROT 7.0 7.3  ALBUMIN 4.2 4.0   Recent Labs  Lab 06/15/21 0841 06/17/21 1046  LIPASE <10* 23   No results for input(s): AMMONIA in the last 168 hours. Coagulation Profile: No results for input(s): INR, PROTIME in the last 168 hours. Cardiac Enzymes: No results for input(s): CKTOTAL, CKMB, CKMBINDEX, TROPONINI in the last 168 hours. BNP (last 3 results) No results for input(s): PROBNP in the last  8760 hours. HbA1C: No results for input(s): HGBA1C in the last 72 hours. CBG: No results for input(s): GLUCAP in the last 168 hours. Lipid Profile: No results for input(s): CHOL, HDL, LDLCALC, TRIG, CHOLHDL, LDLDIRECT in the last 72 hours. Thyroid Function Tests: No results for input(s): TSH, T4TOTAL, FREET4, T3FREE, THYROIDAB in the last 72 hours. Anemia Panel: No results for input(s): VITAMINB12, FOLATE, FERRITIN, TIBC, IRON, RETICCTPCT in the last 72 hours. Urine analysis:    Component Value Date/Time   COLORURINE YELLOW 06/15/2021 1141   APPEARANCEUR CLEAR 06/15/2021 1141   LABSPEC >1.046 (H) 06/15/2021 1141   PHURINE 6.0 06/15/2021 1141   GLUCOSEU NEGATIVE 06/15/2021 1141   HGBUR MODERATE (A) 06/15/2021 1141   BILIRUBINUR NEGATIVE 06/15/2021 1141   KETONESUR NEGATIVE 06/15/2021 1141   PROTEINUR 30 (A) 06/15/2021 1141   NITRITE POSITIVE (A) 06/15/2021 1141   LEUKOCYTESUR NEGATIVE 06/15/2021 1141    Radiological Exams on Admission: MR Abdomen W or Wo Contrast  Result Date: 06/17/2021 CLINICAL DATA:  Complex cystic liver lesion on recent ultrasound. EXAM: MRI ABDOMEN WITHOUT AND WITH CONTRAST TECHNIQUE: Multiplanar multisequence MR imaging of the abdomen was performed both before and after the administration of intravenous contrast. CONTRAST:  55mL GADAVIST GADOBUTROL 1 MMOL/ML IV SOLN COMPARISON:  None. FINDINGS: Lower chest: Small right and tiny left pleural effusions are seen. Bibasilar atelectasis is noted. Hepatobiliary: Several simple cysts are seen in both the right and left hepatic lobes, largest in the anterior right lobe measuring 5 cm. A large complex cystic lesion is seen involving the anterior and posterior segments of the right lobe, which measures 13.2 x 12.9 cm. This lesion shows T1 hyperintense fluid and multiple thin irregular internal septations, some showing mild contrast enhancement on subtraction imaging. No solid nodular components identified. No evidence of  edema in adjacent hepatic parenchyma. No solid hepatic masses are identified. Gallbladder is unremarkable. No evidence of biliary ductal dilatation. Pancreas:  No mass or inflammatory changes. Spleen:  Within normal limits in size and appearance. Adrenals/Urinary Tract: No masses identified. No evidence of hydronephrosis. Stomach/Bowel: Visualized portion unremarkable. Vascular/Lymphatic: No pathologically enlarged lymph nodes identified. No acute vascular findings. Other:  None. Musculoskeletal:  No suspicious bone lesions identified. IMPRESSION: 13.2 cm complex cystic lesion in right hepatic lobe, with nonspecific but probably benign characteristics. Abscess is considered unlikely, however biliary cystadenoma cannot be excluded. Small bilateral pleural effusions and bibasilar atelectasis. Electronically Signed   By: Danae Orleans M.D.   On: 06/17/2021 13:43    Lanae Boast MD [facility]  If 7PM-7AM, please contact night-coverage [email or web address]  06/17/2021, 5:29 PM

## 2021-06-18 ENCOUNTER — Other Ambulatory Visit (HOSPITAL_COMMUNITY): Payer: Self-pay | Admitting: Physician Assistant

## 2021-06-18 ENCOUNTER — Inpatient Hospital Stay (HOSPITAL_COMMUNITY): Payer: 59

## 2021-06-18 DIAGNOSIS — R1011 Right upper quadrant pain: Secondary | ICD-10-CM

## 2021-06-18 DIAGNOSIS — K7689 Other specified diseases of liver: Secondary | ICD-10-CM

## 2021-06-18 LAB — COMPREHENSIVE METABOLIC PANEL
ALT: 22 U/L (ref 0–44)
AST: 17 U/L (ref 15–41)
Albumin: 3.4 g/dL — ABNORMAL LOW (ref 3.5–5.0)
Alkaline Phosphatase: 67 U/L (ref 38–126)
Anion gap: 8 (ref 5–15)
BUN: 9 mg/dL (ref 6–20)
CO2: 26 mmol/L (ref 22–32)
Calcium: 8.6 mg/dL — ABNORMAL LOW (ref 8.9–10.3)
Chloride: 104 mmol/L (ref 98–111)
Creatinine, Ser: 0.79 mg/dL (ref 0.44–1.00)
GFR, Estimated: >60 mL/min (ref >60)
Glucose, Bld: 90 mg/dL (ref 70–99)
Potassium: 3.9 mmol/L (ref 3.5–5.1)
Sodium: 138 mmol/L (ref 135–145)
Total Bilirubin: 0.6 mg/dL (ref 0.3–1.2)
Total Protein: 6.2 g/dL — ABNORMAL LOW (ref 6.5–8.1)

## 2021-06-18 LAB — CBC
HCT: 34.9 % — ABNORMAL LOW (ref 36.0–46.0)
Hemoglobin: 11.6 g/dL — ABNORMAL LOW (ref 12.0–15.0)
MCH: 29.1 pg (ref 26.0–34.0)
MCHC: 33.2 g/dL (ref 30.0–36.0)
MCV: 87.5 fL (ref 80.0–100.0)
Platelets: 220 10*3/uL (ref 150–400)
RBC: 3.99 MIL/uL (ref 3.87–5.11)
RDW: 12.2 % (ref 11.5–15.5)
WBC: 7.4 10*3/uL (ref 4.0–10.5)
nRBC: 0 % (ref 0.0–0.2)

## 2021-06-18 LAB — HIV ANTIBODY (ROUTINE TESTING W REFLEX): HIV Screen 4th Generation wRfx: NONREACTIVE

## 2021-06-18 MED ORDER — SODIUM CHLORIDE 0.9% FLUSH
INTRAVENOUS | Status: AC | PRN
  Administered 2021-06-18: 10 mL via INTRADERMAL

## 2021-06-18 MED ORDER — MIDAZOLAM HCL 2 MG/2ML IJ SOLN
INTRAMUSCULAR | Status: AC
  Filled 2021-06-18: qty 4

## 2021-06-18 MED ORDER — MIDAZOLAM HCL 2 MG/2ML IJ SOLN
INTRAMUSCULAR | Status: AC | PRN
  Administered 2021-06-18: 1 mg via INTRAVENOUS

## 2021-06-18 MED ORDER — SODIUM CHLORIDE 0.9% FLUSH
5.0000 mL | Freq: Three times a day (TID) | INTRAVENOUS | Status: DC
  Administered 2021-06-18 – 2021-06-22 (×9): 5 mL

## 2021-06-18 MED ORDER — FENTANYL CITRATE (PF) 100 MCG/2ML IJ SOLN
INTRAMUSCULAR | Status: AC | PRN
  Administered 2021-06-18: 50 ug via INTRAVENOUS

## 2021-06-18 MED ORDER — DIPHENHYDRAMINE HCL 50 MG/ML IJ SOLN
INTRAMUSCULAR | Status: AC
  Filled 2021-06-18: qty 1

## 2021-06-18 MED ORDER — HYDROMORPHONE HCL 1 MG/ML IJ SOLN
1.0000 mg | INTRAMUSCULAR | Status: DC | PRN
Start: 2021-06-18 — End: 2021-06-19
  Administered 2021-06-18 – 2021-06-19 (×4): 2 mg via INTRAVENOUS
  Administered 2021-06-19: 1 mg via INTRAVENOUS
  Filled 2021-06-18: qty 1
  Filled 2021-06-18 (×4): qty 2

## 2021-06-18 MED ORDER — SODIUM CHLORIDE 0.9 % IV SOLN
INTRAVENOUS | Status: AC
  Filled 2021-06-18: qty 250

## 2021-06-18 MED ORDER — FENTANYL CITRATE (PF) 100 MCG/2ML IJ SOLN
INTRAMUSCULAR | Status: AC
  Filled 2021-06-18: qty 4

## 2021-06-18 NOTE — Progress Notes (Signed)
Transition of Care [facility]) Screening Note  Patient Details  Name: Megan Wilkins Date of Birth: 05-24-1971  Transition of Care [facility]) CM/SW Contact:    Ewing Schlein, LCSW Phone Number: 06/18/2021, 11:01 AM  Transition of Care Department [facility]) has reviewed patient and no TOC needs have been identified at this time. We will continue to monitor patient advancement through interdisciplinary progression rounds. If new patient transition needs arise, please place a TOC consult.

## 2021-06-18 NOTE — Consult Note (Signed)
Chief Complaint: Patient was seen in consultation today for  Chief Complaint  Patient presents with   Abdominal Pain   at the request of Dr Anitra Lauth  Supervising Physician: Marliss Coots  Patient Status: [facility] - In-pt  History of Present Illness: Briany Aye is a 50 y.o. female Geneveive Furness is a 50 y.o. female with history of hypertension.  She presented to the ED for evaluation of worsening right upper quadrant abdominal pain and found to have large complex cyst in her liver.  It has caused N/V and unrelenting pain.  IR has been consulted for drainage. She is accompanied by her husband at bedside.  They are eager to have the cyst drained.  Pt reports she cannot tolerate any "caine" anesthetic as it causes angioedema  Past Medical History:  Diagnosis Date   Hypertension     Past Surgical History:  Procedure Laterality Date   KNEE ARTHROSCOPY     Meniscus    Allergies: Aspercreme [trolamine salicylate] and Novocain [procaine]  Medications: Prior to Admission medications   Medication Sig Start Date End Date Taking? Authorizing Provider  acetaminophen (TYLENOL) 500 MG tablet Take 1,000 mg by mouth every 6 (six) hours as needed for mild pain.   Yes [provider]  Azelastine-Fluticasone 137-50 MCG/ACT SUSP Place 1 spray into both nostrils 2 (two) times daily. 01/07/21  Yes [provider]  escitalopram (LEXAPRO) 20 MG tablet Take 20 mg by mouth every evening. 08/20/20  Yes [provider]  Ferrous Sulfate (SLOW FE PO) Take 1 tablet by mouth daily.   Yes [provider]  levocetirizine (XYZAL) 5 MG tablet Take 5 mg by mouth every evening.   Yes [provider]  lisinopril (ZESTRIL) 10 MG tablet Take 10 mg by mouth every evening. 04/17/21  Yes [provider]  montelukast (SINGULAIR) 10 MG tablet Take 10 mg by mouth every evening. 05/01/21  Yes [provider]  Multiple Vitamin (MULTIVITAMIN) tablet Take 1  tablet by mouth daily.   Yes [provider]  ondansetron (ZOFRAN) 4 MG tablet Take 1 tablet (4 mg total) by mouth every 6 (six) hours. Patient taking differently: Take 4 mg by mouth every 6 (six) hours as needed for nausea or vomiting. 06/15/21  Yes Gwyneth Sprout, MD  oxyCODONE-acetaminophen (PERCOCET/ROXICET) 5-325 MG tablet Take 1 tablet by mouth every 6 (six) hours as needed for severe pain. 06/15/21  Yes Gwyneth Sprout, MD  PROAIR RESPICLICK 108 (90 Base) MCG/ACT AEPB Inhale 1 puff into the lungs every 6 (six) hours as needed for wheezing or shortness of breath. 01/07/21  Yes [provider]  ZAFEMY 150-35 MCG/24HR transdermal patch Place 1 patch onto the skin once a week. 06/04/21  Yes [provider]     History reviewed. No pertinent family history.  Social History   Socioeconomic History   Marital status: Married    Spouse name: Not on file   Number of children: Not on file   Years of education: Not on file   Highest education level: Not on file  Occupational History   Not on file  Tobacco Use   Smoking status: Not on file   Smokeless tobacco: Not on file  Vaping Use   Vaping Use: Never used  Substance and Sexual Activity   Alcohol use: Not on file   Drug use: Not on file   Sexual activity: Not on file  Other Topics Concern   Not on file  Social History Narrative   Not  on file   Social Determinants of Health   Financial Resource Strain: Not on file  Food Insecurity: Not on file  Transportation Needs: Not on file  Physical Activity: Not on file  Stress: Not on file  Social Connections: Not on file    Review of Systems  Constitutional:  Positive for activity change and appetite change.  HENT: Negative.    Eyes: Negative.   Respiratory: Negative.    Cardiovascular: Negative.   Gastrointestinal:  Positive for abdominal distention, abdominal pain, nausea and vomiting.  Endocrine: Negative.   Genitourinary: Negative.    Musculoskeletal: Negative.   Allergic/Immunologic: Negative.   Neurological: Negative.   Hematological: Negative.   Psychiatric/Behavioral: Negative.     Vital Signs: BP (!) 147/79 (BP Location: Right Arm)    Pulse 74    Temp 99 F (37.2 C) (Oral)    Resp 16    Ht 5\' 3"  (1.6 m)    Wt 150 lb (68 kg)    SpO2 92%    BMI 26.57 kg/m   Physical Exam Constitutional:      General: She is in acute distress.     Appearance: She is well-developed. She is not ill-appearing.  HENT:     Head: Normocephalic and atraumatic.     Mouth/Throat:     Mouth: Mucous membranes are moist.     Pharynx: Oropharynx is clear.  Eyes:     Extraocular Movements: Extraocular movements intact.  Cardiovascular:     Rate and Rhythm: Normal rate and regular rhythm.  Pulmonary:     Effort: Pulmonary effort is normal.  Abdominal:     Palpations: Abdomen is soft.     Tenderness: There is abdominal tenderness in the right upper quadrant.  Skin:    General: Skin is dry.     Capillary Refill: Capillary refill takes less than 2 seconds.  Neurological:     General: No focal deficit present.     Mental Status: She is alert and oriented to person, place, and time.  Psychiatric:        Mood and Affect: Mood is anxious.        Behavior: Behavior normal.    Imaging: CT Angio Chest PE W and/or Wo Contrast  Result Date: 06/15/2021 CLINICAL DATA:  Pulmonary embolism suspected. Positive D-dimer. Right-sided pain. EXAM: CT ANGIOGRAPHY CHEST WITH CONTRAST TECHNIQUE: Multidetector CT imaging of the chest was performed using the standard protocol during bolus administration of intravenous contrast. Multiplanar CT image reconstructions and MIPs were obtained to evaluate the vascular anatomy. CONTRAST:  50mL OMNIPAQUE IOHEXOL 350 MG/ML SOLN COMPARISON:  Chest radiography same day FINDINGS: Cardiovascular: Borderline cardiomegaly. Small amount of pericardial fluid in the superior recess. No coronary artery calcification. Mild  ectasia of the aorta. Maximal diameter of the ascending aorta is 3.4 cm. Pulmonary arterial opacification is good. There are no pulmonary emboli. Mediastinum/Nodes: No mediastinal or hilar mass or adenopathy. Lungs/Pleura: Small right pleural effusion layering dependently. Patchy infiltrate in both lower lobes, more extensive on the right than the left, consistent with bronchopneumonia. No lobar consolidation or collapse. Upper lobes are clear. Upper Abdomen: Massive cyst within the right lobe of the liver measuring up to 14 cm in diameter. Adjacent cyst at the inferior margin of the larger cyst measuring up to 5 cm in diameter. Several other scattered small cysts within the liver. Musculoskeletal: Normal Review of the MIP images confirms the above findings. IMPRESSION: No pulmonary emboli. Small pleural effusion on the right. Patchy infiltrate and  volume loss in both lower lobes consistent with bronchopneumonia. No dense consolidation or lobar collapse. Mild ectasia of the aorta but without evidence of frank aneurysm or atherosclerotic calcification. Maximal diameter of the ascending aorta is 3.4 cm. No follow-up imaging recommended at this point. Multiple liver cysts, including a massive cyst in the right lobe measuring up to 14 cm in diameter. Electronically Signed   By: Paulina Fusi M.D.   On: 06/15/2021 10:02   MR Abdomen W or Wo Contrast  Result Date: 06/17/2021 CLINICAL DATA:  Complex cystic liver lesion on recent ultrasound. EXAM: MRI ABDOMEN WITHOUT AND WITH CONTRAST TECHNIQUE: Multiplanar multisequence MR imaging of the abdomen was performed both before and after the administration of intravenous contrast. CONTRAST:  6mL GADAVIST GADOBUTROL 1 MMOL/ML IV SOLN COMPARISON:  None. FINDINGS: Lower chest: Small right and tiny left pleural effusions are seen. Bibasilar atelectasis is noted. Hepatobiliary: Several simple cysts are seen in both the right and left hepatic lobes, largest in the anterior right  lobe measuring 5 cm. A large complex cystic lesion is seen involving the anterior and posterior segments of the right lobe, which measures 13.2 x 12.9 cm. This lesion shows T1 hyperintense fluid and multiple thin irregular internal septations, some showing mild contrast enhancement on subtraction imaging. No solid nodular components identified. No evidence of edema in adjacent hepatic parenchyma. No solid hepatic masses are identified. Gallbladder is unremarkable. No evidence of biliary ductal dilatation. Pancreas:  No mass or inflammatory changes. Spleen:  Within normal limits in size and appearance. Adrenals/Urinary Tract: No masses identified. No evidence of hydronephrosis. Stomach/Bowel: Visualized portion unremarkable. Vascular/Lymphatic: No pathologically enlarged lymph nodes identified. No acute vascular findings. Other:  None. Musculoskeletal:  No suspicious bone lesions identified. IMPRESSION: 13.2 cm complex cystic lesion in right hepatic lobe, with nonspecific but probably benign characteristics. Abscess is considered unlikely, however biliary cystadenoma cannot be excluded. Small bilateral pleural effusions and bibasilar atelectasis. Electronically Signed   By: Danae Orleans M.D.   On: 06/17/2021 13:43   DG Chest Port 1 View  Result Date: 06/15/2021 CLINICAL DATA:  Right rib pain with no injury. EXAM: PORTABLE CHEST 1 VIEW COMPARISON:  None. FINDINGS: Numerous leads and wires project over the chest. Midline trachea. Normal heart size for level of inspiration. Mild to moderate right hemidiaphragm elevation. No pleural effusion or pneumothorax. Left base airspace disease. Minimal medial right lung base volume loss and atelectasis. IMPRESSION: Left base airspace disease, suspicious for pneumonia. Correlate with infectious symptoms, given clinical history of right rib pain. Followup PA and lateral chest X-ray is recommended in 3-4 weeks following trial of antibiotic therapy to ensure resolution and  exclude underlying malignancy. In addition, comparison with prior radiographs would be informative if available. Electronically Signed   By: Jeronimo Greaves M.D.   On: 06/15/2021 08:45   US Abdomen Limited RUQ (LIVER/GB)  Result Date: 06/15/2021 CLINICAL DATA:  Right upper quadrant pain. Large cyst seen on chest CT EXAM: ULTRASOUND ABDOMEN LIMITED RIGHT UPPER QUADRANT COMPARISON:  CT chest 06/15/2021 FINDINGS: Gallbladder: No gallstones or wall thickening visualized. No sonographic Murphy sign noted by sonographer. Common bile duct: Diameter: 5 mm. Liver: Large complex cyst at the right hepatic lobe measuring approximately 17.2 x 12.3 x 16.3 cm containing multiple thickened, irregular internal septations, some of which demonstrate vascularity on color Doppler. Simple ovoid 4.6 cm cyst within the anterior aspect of the left hepatic lobe. Simple 1.2 cm cyst within the left hepatic lobe. Within normal limits in parenchymal echogenicity.  Portal vein is patent on color Doppler imaging with normal direction of blood flow towards the liver. Other: None. IMPRESSION: 1. Large complex right hepatic lobe cyst measuring up to 17.2 cm with multiple thickened, irregular internal septations. Differential includes complex hemorrhagic or infected hepatic cyst versus cystic hepatic neoplasm such as biliary cystadenoma or cystadenocarcinoma. Further evaluation with hepatic protocol MRI without and with IV contrast is recommended. 2. Additional smaller cysts within the liver, which appears simple. Electronically Signed   By: Duanne Guess D.O.   On: 06/15/2021 12:04    Labs:  CBC: Recent Labs    06/15/21 0841 06/17/21 1046 06/18/21 0420  WBC 13.5* 8.1 7.4  HGB 13.4 13.0 11.6*  HCT 39.7 38.9 34.9*  PLT 222 230 220    COAGS: No results for input(s): INR, APTT in the last 8760 hours.  BMP: Recent Labs    06/15/21 0841 06/17/21 1046 06/18/21 0420  NA 136 138 138  K 3.5 3.4* 3.9  CL 103 103 104  CO2 23 25 26    GLUCOSE 148* 100* 90  BUN 10 11 9   CALCIUM 8.9 8.9 8.6*  CREATININE 0.53 0.70 0.79  GFRNONAA >60 >60 >60    LIVER FUNCTION TESTS: Recent Labs    06/15/21 0841 06/17/21 1046 06/18/21 0420  BILITOT 0.6 0.7 0.6  AST 21 20 17   ALT 33 27 22  ALKPHOS 94 79 67  PROT 7.0 7.3 6.2*  ALBUMIN 4.2 4.0 3.4*    Assessment and Plan:  Hepatic Cyst --large, causing pain and amenable to drainage --plan to leave drain in place and address possible sclerotherapy in approximately 2 weeks --Have reviewed chart, allergies, vitals, history, and labs.  OK to proceed.  Will find out appropriate local anesthetic.  Risks and benefits discussed with the patient including bleeding, infection, damage to adjacent structures, bowel perforation/fistula connection, and sepsis.  All of the patient's questions were answered, patient is agreeable to proceed. Consent signed and in IR.   Thank you for this interesting consult.  I greatly enjoyed meeting Tareka Jhaveri and look forward to participating in their care.  A copy of this report was sent to the requesting provider on this date.  Electronically Signed: 06/20/21, PA 06/18/2021, 10:27 AM   I spent a total of 20 Minutes in face to face in clinical consultation, greater than 50% of which was counseling/coordinating care for percutaneous drainage of hepatic cyst

## 2021-06-18 NOTE — Progress Notes (Signed)
PROGRESS NOTE    Megan Wilkins  PZW:[id number] DOB: 01-05-1971 DOA: 06/17/2021 PCP: Sigmund Hazel, MD    Chief Complaint  Patient presents with   Abdominal Pain    Brief Narrative:  Megan Wilkins is a 50 y.o. female with history of hypertension presents to the ED for evaluation of worsening right upper quadrant abdominal pain.  Underwent MRI of the abdomen that showed 13.2 complex cystic lesion in the right hepatic lobe probably benign characteristic, abscess is considered unlikely however biliary cystadenoma cannot be excluded.  Patient received IV Dilaudid IV fluid bolus and Zofran for pain control . GI and surgery was consulted GI advised to consult with surgery for possible resection given significant pain.  Dr. Freida Busman from surgery was consulted and he advised IR to drain if patient has recurrence symptoms,  surgery can be involved.   IR was consulted, underwent CT guided hepatic cyst aspiration and drainage.  1.4 lit of thin, brown colored fluid aspirated and a pig tail drain placed into hepatic cyst. Samples sent for cytology and culture.   Assessment & Plan:   Principal Problem:   Hepatic cyst Active Problems:   Menopause   Right upper quadrant abdominal pain Secondary to 13.2 cm complex cystic lesion right hepatic lobe: IR was consulted, underwent CT guided hepatic cyst aspiration and drainage.  1.4 lit of thin, brown colored fluid aspirated and a pig tail drain placed into hepatic cyst. Samples sent for cytology and culture.  Continue with IV fluids, IV dilaudid and anti emetics.     Hypertension:  BP parameters are sub optimal probably from pain.    Hypokalemia: replaced.     DVT prophylaxis: scd's Code Status: full code.  Family Communication: family at bedside.  Disposition:   Status is: Inpatient  Remains inpatient appropriate because: Liver biopsy.        Consultants:  IR Gen surgery GI.   Procedures: liver biopsy  Antimicrobials:  none.   Subjective: Pain not well controlled with 0.5 mg of Dilaudid.   Objective: Vitals:   06/18/21 1155 06/18/21 1200 06/18/21 1205 06/18/21 1215  BP: (!) 151/86 (!) 149/78 (!) 150/72 (!) 148/71  Pulse: 77 73 75   Resp: 16 (!) 8 19   Temp:      TempSrc:      SpO2: 92% 94% 97% 95%  Weight:      Height:        Intake/Output Summary (Last 24 hours) at 06/18/2021 1307 Last data filed at 06/18/2021 1206 Gross per 24 hour  Intake 898.35 ml  Output 2700 ml  Net -1801.65 ml   Filed Weights   06/17/21 1900  Weight: 68 kg    Examination:  General exam: Well developed lady, in  mild distress from pain.  Respiratory system: Clear to auscultation. Respiratory effort normal. Cardiovascular system: S1 & S2 heard, RRR. No JVD,  No pedal edema. Gastrointestinal system: Abdomen is soft, distended, bowel sounds wnl, tender in the RUQ. Central nervous system: Alert and oriented. No focal neurological deficits. Extremities: Symmetric 5 x 5 power. Skin: No rashes, lesions or ulcers Psychiatry: Mood & affect appropriate.     Data Reviewed: I have personally reviewed following labs and imaging studies  CBC: Recent Labs  Lab 06/15/21 0841 06/17/21 1046 06/18/21 0420  WBC 13.5* 8.1 7.4  NEUTROABS 12.1*  --   --   HGB 13.4 13.0 11.6*  HCT 39.7 38.9 34.9*  MCV 85.2 86.6 87.5  PLT 222 230 220    Basic  Metabolic Panel: Recent Labs  Lab 06/15/21 0841 06/17/21 1046 06/18/21 0420  NA 136 138 138  K 3.5 3.4* 3.9  CL 103 103 104  CO2 23 25 26   GLUCOSE 148* 100* 90  BUN 10 11 9   CREATININE 0.53 0.70 0.79  CALCIUM 8.9 8.9 8.6*    GFR: Estimated Creatinine Clearance: 77 mL/min (by C-G formula based on SCr of 0.79 mg/dL).  Liver Function Tests: Recent Labs  Lab 06/15/21 0841 06/17/21 1046 06/18/21 0420  AST 21 20 17   ALT 33 27 22  ALKPHOS 94 79 67  BILITOT 0.6 0.7 0.6  PROT 7.0 7.3 6.2*  ALBUMIN 4.2 4.0 3.4*    CBG: No results for input(s): GLUCAP in the last  168 hours.   Recent Results (from the past 240 hour(s))  Resp Panel by RT-PCR (Flu A&B, Covid) Nasopharyngeal Swab     Status: None   Collection Time: 06/17/21 11:00 AM   Specimen: Nasopharyngeal Swab; Nasopharyngeal(NP) swabs in vial transport medium  Result Value Ref Range Status   SARS Coronavirus 2 by RT PCR NEGATIVE NEGATIVE Final    Comment: (NOTE) SARS-CoV-2 target nucleic acids are NOT DETECTED.  The SARS-CoV-2 RNA is generally detectable in upper respiratory specimens during the acute phase of infection. The lowest concentration of SARS-CoV-2 viral copies this assay can detect is 138 copies/mL. A negative result does not preclude SARS-Cov-2 infection and should not be used as the sole basis for treatment or other patient management decisions. A negative result may occur with  improper specimen collection/handling, submission of specimen other than nasopharyngeal swab, presence of viral mutation(s) within the areas targeted by this assay, and inadequate number of viral copies(<138 copies/mL). A negative result must be combined with clinical observations, patient history, and epidemiological information. The expected result is Negative.  Fact Sheet for Patients:  06/20/21  Fact Sheet for Healthcare Providers:   This test is no t yet approved or cleared by the 06/19/21 FDA and  has been authorized for detection and/or diagnosis of SARS-CoV-2 by FDA under an Emergency Use Authorization (EUA). This EUA will remain  in effect (meaning this test can be used) for the duration of the COVID-19 declaration under Section 564(b)(1) of the Act, 21 U.S.C.section 360bbb-3(b)(1), unless the authorization is terminated  or revoked sooner.       Influenza A by PCR NEGATIVE NEGATIVE Final   Influenza B by PCR NEGATIVE NEGATIVE Final    Comment: (NOTE) The Xpert Xpress SARS-CoV-2/FLU/RSV plus assay is  intended as an aid in the diagnosis of influenza from Nasopharyngeal swab specimens and should not be used as a sole basis for treatment. Nasal washings and aspirates are unacceptable for Xpert Xpress SARS-CoV-2/FLU/RSV testing.  Fact Sheet for Patients: BloggerCourse.com  Fact Sheet for Healthcare Providers: SeriousBroker.it  This test is not yet approved or cleared by the [place] FDA and has been authorized for detection and/or diagnosis of SARS-CoV-2 by FDA under an Emergency Use Authorization (EUA). This EUA will remain in effect (meaning this test can be used) for the duration of the COVID-19 declaration under Section 564(b)(1) of the Act, 21 U.S.C. section 360bbb-3(b)(1), unless the authorization is terminated or revoked.  Performed at [facility], [address] [place], [place]          Radiology Studies: MR Abdomen W or Wo Contrast  Result Date: 06/17/2021 CLINICAL DATA:  Complex cystic liver lesion on recent ultrasound. EXAM: MRI ABDOMEN WITHOUT AND WITH CONTRAST  TECHNIQUE: Multiplanar multisequence MR imaging of the abdomen was performed both before and after the administration of intravenous contrast. CONTRAST:  40mL GADAVIST GADOBUTROL 1 MMOL/ML IV SOLN COMPARISON:  None. FINDINGS: Lower chest: Small right and tiny left pleural effusions are seen. Bibasilar atelectasis is noted. Hepatobiliary: Several simple cysts are seen in both the right and left hepatic lobes, largest in the anterior right lobe measuring 5 cm. A large complex cystic lesion is seen involving the anterior and posterior segments of the right lobe, which measures 13.2 x 12.9 cm. This lesion shows T1 hyperintense fluid and multiple thin irregular internal septations, some showing mild contrast enhancement on subtraction imaging. No solid nodular components identified. No evidence of edema in adjacent hepatic parenchyma.  No solid hepatic masses are identified. Gallbladder is unremarkable. No evidence of biliary ductal dilatation. Pancreas:  No mass or inflammatory changes. Spleen:  Within normal limits in size and appearance. Adrenals/Urinary Tract: No masses identified. No evidence of hydronephrosis. Stomach/Bowel: Visualized portion unremarkable. Vascular/Lymphatic: No pathologically enlarged lymph nodes identified. No acute vascular findings. Other:  None. Musculoskeletal:  No suspicious bone lesions identified. IMPRESSION: 13.2 cm complex cystic lesion in right hepatic lobe, with nonspecific but probably benign characteristics. Abscess is considered unlikely, however biliary cystadenoma cannot be excluded. Small bilateral pleural effusions and bibasilar atelectasis. Electronically Signed   By: Danae Orleans M.D.   On: 06/17/2021 13:43        Scheduled Meds:  Azelastine-Fluticasone  1 spray Each Nare BID   diphenhydrAMINE       escitalopram  20 mg Oral QPM   lisinopril  10 mg Oral QPM   loratadine  10 mg Oral QPM   montelukast  10 mg Oral QPM   multivitamin with minerals  1 tablet Oral Daily   norelgestromin-ethinyl estradiol  1 patch Transdermal Weekly   Continuous Infusions:  0.9 % NaCl with KCl 20 mEq / L 75 mL/hr at 06/17/21 1944   sodium chloride       LOS: 1 day        Kathlen Mody, MD [facility]   To contact the attending provider between 7A-7P or the covering provider during after hours 7P-7A, please log into the web site [email or web address] and access using universal [place] password for that web site. If you do not have the password, please call the hospital operator.  06/18/2021, 1:07 PM

## 2021-06-18 NOTE — Procedures (Signed)
Interventional Radiology Procedure Note  Procedure: CT guided hepatic cyst aspiration and drainage  Findings: Please refer to procedural dictation for full description.  1.4 L thin, brown-colored fluid aspirated via 10.2 Fr pigtail drain placed into hepatic cyst.  Drain placed to bag drainage.  Samples sent for cytology and culture.  Complications: None immediate  Estimated Blood Loss: < 5 mL  Recommendations: Keep to bag drainage. IR will arrange for outpatient follow up in 2 weeks for planned sclerotherapy pending cytology/culture results.   Marliss Coots, MD Pager: [phone]

## 2021-06-19 ENCOUNTER — Encounter (HOSPITAL_COMMUNITY): Payer: Self-pay | Admitting: Internal Medicine

## 2021-06-19 MED ORDER — CYCLOBENZAPRINE HCL 5 MG PO TABS
5.0000 mg | ORAL_TABLET | Freq: Three times a day (TID) | ORAL | Status: DC | PRN
  Administered 2021-06-19 – 2021-06-22 (×8): 5 mg via ORAL
  Filled 2021-06-19 (×8): qty 1

## 2021-06-19 MED ORDER — HYDROMORPHONE HCL 1 MG/ML IJ SOLN
1.0000 mg | INTRAMUSCULAR | Status: DC | PRN
  Administered 2021-06-19 – 2021-06-20 (×3): 1 mg via INTRAVENOUS
  Filled 2021-06-19 (×3): qty 1

## 2021-06-19 NOTE — Progress Notes (Signed)
PROGRESS NOTE    Megan Wilkins  KTG:[id number] DOB: Jan 14, 1971 DOA: 06/17/2021 PCP: Sigmund Hazel, MD   Brief Narrative:50 y.o. female with history of hypertension presents to the ED for evaluation of worsening right upper quadrant abdominal pain.  Underwent MRI of the abdomen that showed 13.2 complex cystic lesion in the right hepatic lobe probably benign characteristic, abscess is considered unlikely however biliary cystadenoma cannot be excluded.  Patient received IV Dilaudid IV fluid bolus and Zofran for pain control . GI and surgery was consulted GI advised to consult with surgery for possible resection given significant pain.  Dr. Freida Busman from surgery was consulted and he advised IR to drain if patient has recurrence symptoms,  surgery can be involved.   IR was consulted, underwent CT guided hepatic cyst aspiration and drainage.  1.4 lit of thin, brown colored fluid aspirated and a pig tail drain placed into hepatic cyst. Samples sent for cytology and culture.   Assessment & Plan:   Principal Problem:   Hepatic cyst Active Problems:   Menopause   #1 right upper quadrant abdominal pain due to 13.2 cm complex cystic lesion in the right hepatic lobe.  Status post drainage and aspiration by IR and placement of drain on 06/18/2021. A 1.4 L of thin brown-colored fluid aspirated and a pigtail drain placed to the hepatic cyst. Appreciate IR input.  Patient to follow-up with IR as an outpatient for drain exchange and possible sclerotherapy for the cyst with Dr. Lenn Sink.  Flush the drain with 5 cc normal saline daily and record the output daily.  Change the dressing when soiled or at least once in 2 to 3 days. Patient has been requiring IV Dilaudid overnight 4 times in the last 12 hours.  Continue antiemetics.  She continues complain of spasm in the right upper quadrant and has been needing narcotics and antispasmodic agents. Taper IV narcotics down  #2 hypertension blood pressure 166/81  continue lisinopril 10 mg daily  #3 seasonal allergies on Claritin Singulair and Flonase  #4 depression on Lexapro     Estimated body mass index is 26.57 kg/m as calculated from the following:   Height as of this encounter: 5\' 3"  (1.6 m).   Weight as of this encounter: 68 kg.  DVT prophylaxis: Lovenox  code Status: Full code  family Communication: None at bedside  disposition Plan:  Status is: Inpatient  Remains inpatient appropriate because: Patient's is in severe pain requiring IV narcotics   Consultants:  Interventional radiology  Procedures: Status post aspiration of liver cyst 06/18/2021 Antimicrobials: None  Subjective: Patient complaining of spasm in the right upper quadrant with pain requiring IV Dilaudid multiple times over night. On ivf  Objective: Vitals:   06/19/21 0120 06/19/21 0504 06/19/21 1005 06/19/21 1346  BP: (!) 149/83 (!) 158/80 (!) 163/85 (!) 166/81  Pulse: 90 93 86 95  Resp: 16 16 18 16   Temp: 98.1 F (36.7 C) 100.3 F (37.9 C) 98.1 F (36.7 C) 99.5 F (37.5 C)  TempSrc: Oral Oral Oral Oral  SpO2: (!) 89% 93% 96% 93%  Weight:      Height:        Intake/Output Summary (Last 24 hours) at 06/19/2021 1414 Last data filed at 06/19/2021 1300 Gross per 24 hour  Intake 2501.4 ml  Output 3730 ml  Net -1228.6 ml   Filed Weights   06/17/21 1900  Weight: 68 kg    Examination:  General exam: Appears in mild distress due to pain Respiratory system: Clear  to auscultation. Respiratory effort normal. Cardiovascular system: S1 & S2 heard, RRR. No JVD, murmurs, rubs, gallops or clicks. No pedal edema. Gastrointestinal system: Abdomen is nondistended, soft and right upper quadrant tenderness drains in place No organomegaly or masses felt. Normal bowel sounds heard. Central nervous system: Alert and oriented. No focal neurological deficits. Extremities: Symmetric 5 x 5 power. Skin: No rashes, lesions or ulcers Psychiatry: Judgement and insight  appear normal. Mood & affect appropriate.     Data Reviewed: I have personally reviewed following labs and imaging studies  CBC: Recent Labs  Lab 06/15/21 0841 06/17/21 1046 06/18/21 0420  WBC 13.5* 8.1 7.4  NEUTROABS 12.1*  --   --   HGB 13.4 13.0 11.6*  HCT 39.7 38.9 34.9*  MCV 85.2 86.6 87.5  PLT 222 230 220   Basic Metabolic Panel: Recent Labs  Lab 06/15/21 0841 06/17/21 1046 06/18/21 0420  NA 136 138 138  K 3.5 3.4* 3.9  CL 103 103 104  CO2 23 25 26   GLUCOSE 148* 100* 90  BUN 10 11 9   CREATININE 0.53 0.70 0.79  CALCIUM 8.9 8.9 8.6*   GFR: Estimated Creatinine Clearance: 77 mL/min (by C-G formula based on SCr of 0.79 mg/dL). Liver Function Tests: Recent Labs  Lab 06/15/21 0841 06/17/21 1046 06/18/21 0420  AST 21 20 17   ALT 33 27 22  ALKPHOS 94 79 67  BILITOT 0.6 0.7 0.6  PROT 7.0 7.3 6.2*  ALBUMIN 4.2 4.0 3.4*   Recent Labs  Lab 06/15/21 0841 06/17/21 1046  LIPASE <10* 23   No results for input(s): AMMONIA in the last 168 hours. Coagulation Profile: No results for input(s): INR, PROTIME in the last 168 hours. Cardiac Enzymes: No results for input(s): CKTOTAL, CKMB, CKMBINDEX, TROPONINI in the last 168 hours. BNP (last 3 results) No results for input(s): PROBNP in the last 8760 hours. HbA1C: No results for input(s): HGBA1C in the last 72 hours. CBG: No results for input(s): GLUCAP in the last 168 hours. Lipid Profile: No results for input(s): CHOL, HDL, LDLCALC, TRIG, CHOLHDL, LDLDIRECT in the last 72 hours. Thyroid Function Tests: No results for input(s): TSH, T4TOTAL, FREET4, T3FREE, THYROIDAB in the last 72 hours. Anemia Panel: No results for input(s): VITAMINB12, FOLATE, FERRITIN, TIBC, IRON, RETICCTPCT in the last 72 hours. Sepsis Labs: Recent Labs  Lab 06/17/21 1046  PROCALCITON <0.10    Recent Results (from the past 240 hour(s))  Resp Panel by RT-PCR (Flu A&B, Covid) Nasopharyngeal Swab     Status: None   Collection Time:  06/17/21 11:00 AM   Specimen: Nasopharyngeal Swab; Nasopharyngeal(NP) swabs in vial transport medium  Result Value Ref Range Status   SARS Coronavirus 2 by RT PCR NEGATIVE NEGATIVE Final    Comment: (NOTE) SARS-CoV-2 target nucleic acids are NOT DETECTED.  The SARS-CoV-2 RNA is generally detectable in upper respiratory specimens during the acute phase of infection. The lowest concentration of SARS-CoV-2 viral copies this assay can detect is 138 copies/mL. A negative result does not preclude SARS-Cov-2 infection and should not be used as the sole basis for treatment or other patient management decisions. A negative result may occur with  improper specimen collection/handling, submission of specimen other than nasopharyngeal swab, presence of viral mutation(s) within the areas targeted by this assay, and inadequate number of viral copies(<138 copies/mL). A negative result must be combined with clinical observations, patient history, and epidemiological information. The expected result is Negative.  Fact Sheet for Patients:  06/19/21  Fact Sheet for  Healthcare Providers:  SeriousBroker.it  This test is no t yet approved or cleared by the [place] and  has been authorized for detection and/or diagnosis of SARS-CoV-2 by FDA under an Emergency Use Authorization (EUA). This EUA will remain  in effect (meaning this test can be used) for the duration of the COVID-19 declaration under Section 564(b)(1) of the Act, 21 U.S.C.section 360bbb-3(b)(1), unless the authorization is terminated  or revoked sooner.       Influenza A by PCR NEGATIVE NEGATIVE Final   Influenza B by PCR NEGATIVE NEGATIVE Final    Comment: (NOTE) The Xpert Xpress SARS-CoV-2/FLU/RSV plus assay is intended as an aid in the diagnosis of influenza from Nasopharyngeal swab specimens and should not be used as a sole basis for treatment. Nasal washings  and aspirates are unacceptable for Xpert Xpress SARS-CoV-2/FLU/RSV testing.  Fact Sheet for Patients: BloggerCourse.com  Fact Sheet for Healthcare Providers: SeriousBroker.it  This test is not yet approved or cleared by the [place] FDA and has been authorized for detection and/or diagnosis of SARS-CoV-2 by FDA under an Emergency Use Authorization (EUA). This EUA will remain in effect (meaning this test can be used) for the duration of the COVID-19 declaration under Section 564(b)(1) of the Act, 21 U.S.C. section 360bbb-3(b)(1), unless the authorization is terminated or revoked.  Performed at [facility], [address] [place], [place] [address]   Aerobic/Anaerobic Culture w Gram Stain (surgical/deep wound)     Status: None (Preliminary result)   Collection Time: 06/18/21 12:43 PM   Specimen: Liver; Abscess  Result Value Ref Range Status   Specimen Description   Final    LIVER Performed at [facility], [address] [place], [place] [address]    Special Requests   Final    NONE Performed at [facility], [address], [place], [place] [address]    Gram Stain   Final    MODERATE WBC PRESENT,BOTH PMN AND MONONUCLEAR NO ORGANISMS SEEN    Culture   Final    NO GROWTH < 24 HOURS Performed at [facility], [address] [place], [place] [address]    Report Status PENDING  Incomplete         Radiology Studies: CT IMAGE GUIDED DRAINAGE BY PERCUTANEOUS CATHETER  Result Date: 06/18/2021 INDICATION: 50 year old female with history of symptomatic hepatic cyst. EXAM: CT IMAGE GUIDED DRAINAGE BY PERCUTANEOUS CATHETER COMPARISON:  06/17/2021 MEDICATIONS: The patient is currently admitted to the hospital and receiving intravenous antibiotics. The antibiotics were administered within an appropriate time frame prior to the initiation of the procedure.  ANESTHESIA/SEDATION: Moderate (conscious) sedation was employed during this procedure. A total of Versed 4 mg and Fentanyl 200 mcg was administered intravenously. Moderate Sedation Time: 26 minutes. The patient's level of consciousness and vital signs were monitored continuously by radiology nursing throughout the procedure under my direct supervision. CONTRAST:  None COMPLICATIONS: None immediate. PROCEDURE: Informed written consent was obtained from the patient after a discussion of the risks, benefits and alternatives to treatment. The patient was placed supine on the CT gantry and a pre procedural CT was performed re-demonstrating the known abscess/fluid collection within the right lobe of the liver. The procedure was planned. A timeout was performed prior to the initiation of the procedure. The right upper quadrant was prepped and draped in the usual sterile fashion. The overlying soft tissues were anesthetized with normal saline. Appropriate trajectory was planned with the use  of a 22 gauge spinal needle. An 18 gauge trocar needle was advanced into the abscess/fluid collection and a short Amplatz super stiff wire was coiled within the collection. Appropriate positioning was confirmed with a limited CT scan. The tract was serially dilated allowing placement of a 10.2 [place] all-purpose drainage catheter. Appropriate positioning was confirmed with a limited postprocedural CT scan. 1,400 ml of thin, brown colored fluid was aspirated. The tube was connected to a drainage bag and sutured in place. A dressing was placed. The patient tolerated the procedure well without immediate post procedural complication. IMPRESSION: Successful CT guided placement of a 10.2 [place] all purpose drain catheter into the right hepatic cyst with aspiration of 1.4 L of thin, brown fluid. Samples were sent to the laboratory for culture and cytology. PLAN: Return in 2-3 weeks for drain check and consideration of sclerotherapy. Marliss Coots, MD [facility] Radiology Electronically Signed   By: Marliss Coots M.D.   On: 06/18/2021 14:36        Scheduled Meds:  Azelastine-Fluticasone  1 spray Each Nare BID   escitalopram  20 mg Oral QPM   lisinopril  10 mg Oral QPM   loratadine  10 mg Oral QPM   montelukast  10 mg Oral QPM   multivitamin with minerals  1 tablet Oral Daily   norelgestromin-ethinyl estradiol  1 patch Transdermal Weekly   sodium chloride flush  5 mL Intracatheter Q8H   Continuous Infusions:  0.9 % NaCl with KCl 20 mEq / L 75 mL/hr at 06/19/21 0921     LOS: 2 days    Time spent: 38 minutes  Alwyn Ren, MD 06/19/2021, 2:14 PM

## 2021-06-19 NOTE — Progress Notes (Signed)
Referring Physician(s): Maryan Rued, W. (EDP)  Supervising Physician: Arne Cleveland  Patient Status:  [facility] - In-pt  Chief Complaint:  Large complex hepatic cyst, s.p aspiration and drain placement by Dr. Serafina Royals on 12/29   Subjective:  Pt laying in bed, NAD.  Reports occasional RUQ spasm, Flexeril is helpful.  No other complaints.   Allergies: Aspercreme [trolamine salicylate] and Novocain [procaine]  Medications: Prior to Admission medications   Medication Sig Start Date End Date Taking? Authorizing Provider  acetaminophen (TYLENOL) 500 MG tablet Take 1,000 mg by mouth every 6 (six) hours as needed for mild pain.   Yes [provider]  Azelastine-Fluticasone 137-50 MCG/ACT SUSP Place 1 spray into both nostrils 2 (two) times daily. 01/07/21  Yes [provider]  escitalopram (LEXAPRO) 20 MG tablet Take 20 mg by mouth every evening. 08/20/20  Yes [provider]  Ferrous Sulfate (SLOW FE PO) Take 1 tablet by mouth daily.   Yes [provider]  levocetirizine (XYZAL) 5 MG tablet Take 5 mg by mouth every evening.   Yes [provider]  lisinopril (ZESTRIL) 10 MG tablet Take 10 mg by mouth every evening. 04/17/21  Yes [provider]  montelukast (SINGULAIR) 10 MG tablet Take 10 mg by mouth every evening. 05/01/21  Yes [provider]  Multiple Vitamin (MULTIVITAMIN) tablet Take 1 tablet by mouth daily.   Yes [provider]  ondansetron (ZOFRAN) 4 MG tablet Take 1 tablet (4 mg total) by mouth every 6 (six) hours. Patient taking differently: Take 4 mg by mouth every 6 (six) hours as needed for nausea or vomiting. 06/15/21  Yes Blanchie Dessert, MD  oxyCODONE-acetaminophen (PERCOCET/ROXICET) 5-325 MG tablet Take 1 tablet by mouth every 6 (six) hours as needed for severe pain. 06/15/21  Yes Blanchie Dessert, MD  PROAIR RESPICLICK 299 (90 Base) MCG/ACT AEPB Inhale 1 puff into the lungs every 6 (six) hours as needed  for wheezing or shortness of breath. 01/07/21  Yes [provider]  ZAFEMY 150-35 MCG/24HR transdermal patch Place 1 patch onto the skin once a week. 06/04/21  Yes [provider]     Vital Signs: BP (!) 158/80 (BP Location: Right Arm)    Pulse 93    Temp 100.3 F (37.9 C) (Oral) Comment: see MAR, given tylenol   Resp 16    Ht $R'5\' 3"'UR$  (1.6 m)    Wt 150 lb (68 kg)    SpO2 93%    BMI 26.57 kg/m   Physical Exam Vitals reviewed.  Constitutional:      General: She is not in acute distress.    Appearance: She is well-developed. She is not ill-appearing.  HENT:     Head: Normocephalic and atraumatic.  Pulmonary:     Effort: Pulmonary effort is normal.  Abdominal:     Palpations: Abdomen is soft.  Skin:    General: Skin is warm and dry.     Coloration: Skin is not cyanotic or jaundiced.     Comments: Positive RUQ drain to a gravity bag. Site is unremarkable with no erythema, edema, tenderness, bleeding or drainage. Suture and stat lock in place. Dressing is clean, dry, and intact. 10 ml of  dark brown colored fluid noted in the bag. Drain aspirates well,  flushes but met resistance after administering 2 mL.   Neurological:     Mental Status: She is alert and oriented to person, place, and time.  Psychiatric:        Mood and  Affect: Mood normal.        Behavior: Behavior normal.    Imaging: CT Angio Chest PE W and/or Wo Contrast  Result Date: 06/15/2021 CLINICAL DATA:  Pulmonary embolism suspected. Positive D-dimer. Right-sided pain. EXAM: CT ANGIOGRAPHY CHEST WITH CONTRAST TECHNIQUE: Multidetector CT imaging of the chest was performed using the standard protocol during bolus administration of intravenous contrast. Multiplanar CT image reconstructions and MIPs were obtained to evaluate the vascular anatomy. CONTRAST:  83mL OMNIPAQUE IOHEXOL 350 MG/ML SOLN COMPARISON:  Chest radiography same day FINDINGS: Cardiovascular: Borderline cardiomegaly. Small amount of pericardial  fluid in the superior recess. No coronary artery calcification. Mild ectasia of the aorta. Maximal diameter of the ascending aorta is 3.4 cm. Pulmonary arterial opacification is good. There are no pulmonary emboli. Mediastinum/Nodes: No mediastinal or hilar mass or adenopathy. Lungs/Pleura: Small right pleural effusion layering dependently. Patchy infiltrate in both lower lobes, more extensive on the right than the left, consistent with bronchopneumonia. No lobar consolidation or collapse. Upper lobes are clear. Upper Abdomen: Massive cyst within the right lobe of the liver measuring up to 14 cm in diameter. Adjacent cyst at the inferior margin of the larger cyst measuring up to 5 cm in diameter. Several other scattered small cysts within the liver. Musculoskeletal: Normal Review of the MIP images confirms the above findings. IMPRESSION: No pulmonary emboli. Small pleural effusion on the right. Patchy infiltrate and volume loss in both lower lobes consistent with bronchopneumonia. No dense consolidation or lobar collapse. Mild ectasia of the aorta but without evidence of frank aneurysm or atherosclerotic calcification. Maximal diameter of the ascending aorta is 3.4 cm. No follow-up imaging recommended at this point. Multiple liver cysts, including a massive cyst in the right lobe measuring up to 14 cm in diameter. Electronically Signed   By: Nelson Chimes M.D.   On: 06/15/2021 10:02   MR Abdomen W or Wo Contrast  Result Date: 06/17/2021 CLINICAL DATA:  Complex cystic liver lesion on recent ultrasound. EXAM: MRI ABDOMEN WITHOUT AND WITH CONTRAST TECHNIQUE: Multiplanar multisequence MR imaging of the abdomen was performed both before and after the administration of intravenous contrast. CONTRAST:  42mL GADAVIST GADOBUTROL 1 MMOL/ML IV SOLN COMPARISON:  None. FINDINGS: Lower chest: Small right and tiny left pleural effusions are seen. Bibasilar atelectasis is noted. Hepatobiliary: Several simple cysts are seen in  both the right and left hepatic lobes, largest in the anterior right lobe measuring 5 cm. A large complex cystic lesion is seen involving the anterior and posterior segments of the right lobe, which measures 13.2 x 12.9 cm. This lesion shows T1 hyperintense fluid and multiple thin irregular internal septations, some showing mild contrast enhancement on subtraction imaging. No solid nodular components identified. No evidence of edema in adjacent hepatic parenchyma. No solid hepatic masses are identified. Gallbladder is unremarkable. No evidence of biliary ductal dilatation. Pancreas:  No mass or inflammatory changes. Spleen:  Within normal limits in size and appearance. Adrenals/Urinary Tract: No masses identified. No evidence of hydronephrosis. Stomach/Bowel: Visualized portion unremarkable. Vascular/Lymphatic: No pathologically enlarged lymph nodes identified. No acute vascular findings. Other:  None. Musculoskeletal:  No suspicious bone lesions identified. IMPRESSION: 13.2 cm complex cystic lesion in right hepatic lobe, with nonspecific but probably benign characteristics. Abscess is considered unlikely, however biliary cystadenoma cannot be excluded. Small bilateral pleural effusions and bibasilar atelectasis. Electronically Signed   By: Marlaine Hind M.D.   On: 06/17/2021 13:43   CT IMAGE GUIDED DRAINAGE BY PERCUTANEOUS CATHETER  Result Date: 06/18/2021 INDICATION:  50 year old female with history of symptomatic hepatic cyst. EXAM: CT IMAGE GUIDED DRAINAGE BY PERCUTANEOUS CATHETER COMPARISON:  06/17/2021 MEDICATIONS: The patient is currently admitted to the hospital and receiving intravenous antibiotics. The antibiotics were administered within an appropriate time frame prior to the initiation of the procedure. ANESTHESIA/SEDATION: Moderate (conscious) sedation was employed during this procedure. A total of Versed 4 mg and Fentanyl 200 mcg was administered intravenously. Moderate Sedation Time: 26 minutes.  The patient's level of consciousness and vital signs were monitored continuously by radiology nursing throughout the procedure under my direct supervision. CONTRAST:  None COMPLICATIONS: None immediate. PROCEDURE: Informed written consent was obtained from the patient after a discussion of the risks, benefits and alternatives to treatment. The patient was placed supine on the CT gantry and a pre procedural CT was performed re-demonstrating the known abscess/fluid collection within the right lobe of the liver. The procedure was planned. A timeout was performed prior to the initiation of the procedure. The right upper quadrant was prepped and draped in the usual sterile fashion. The overlying soft tissues were anesthetized with normal saline. Appropriate trajectory was planned with the use of a 22 gauge spinal needle. An 18 gauge trocar needle was advanced into the abscess/fluid collection and a short Amplatz super stiff wire was coiled within the collection. Appropriate positioning was confirmed with a limited CT scan. The tract was serially dilated allowing placement of a 10.2 [place] all-purpose drainage catheter. Appropriate positioning was confirmed with a limited postprocedural CT scan. 1,400 ml of thin, brown colored fluid was aspirated. The tube was connected to a drainage bag and sutured in place. A dressing was placed. The patient tolerated the procedure well without immediate post procedural complication. IMPRESSION: Successful CT guided placement of a 10.2 [place] all purpose drain catheter into the right hepatic cyst with aspiration of 1.4 L of thin, brown fluid. Samples were sent to the laboratory for culture and cytology. PLAN: Return in 2-3 weeks for drain check and consideration of sclerotherapy. Ruthann Cancer, MD [facility] Electronically Signed   By: Ruthann Cancer M.D.   On: 06/18/2021 14:36   US Abdomen Limited RUQ (LIVER/GB)  Result  Date: 06/15/2021 CLINICAL DATA:  Right upper quadrant pain. Large cyst seen on chest CT EXAM: ULTRASOUND ABDOMEN LIMITED RIGHT UPPER QUADRANT COMPARISON:  CT chest 06/15/2021 FINDINGS: Gallbladder: No gallstones or wall thickening visualized. No sonographic Murphy sign noted by sonographer. Common bile duct: Diameter: 5 mm. Liver: Large complex cyst at the right hepatic lobe measuring approximately 17.2 x 12.3 x 16.3 cm containing multiple thickened, irregular internal septations, some of which demonstrate vascularity on color Doppler. Simple ovoid 4.6 cm cyst within the anterior aspect of the left hepatic lobe. Simple 1.2 cm cyst within the left hepatic lobe. Within normal limits in parenchymal echogenicity. Portal vein is patent on color Doppler imaging with normal direction of blood flow towards the liver. Other: None. IMPRESSION: 1. Large complex right hepatic lobe cyst measuring up to 17.2 cm with multiple thickened, irregular internal septations. Differential includes complex hemorrhagic or infected hepatic cyst versus cystic hepatic neoplasm such as biliary cystadenoma or cystadenocarcinoma. Further evaluation with hepatic protocol MRI without and with IV contrast is recommended. 2. Additional smaller cysts within the liver, which appears simple. Electronically Signed   By: Davina Poke D.O.   On: 06/15/2021 12:04    Labs:  CBC: Recent Labs    06/15/21 0841 06/17/21 1046 06/18/21 0420  WBC 13.5* 8.1  7.4  HGB 13.4 13.0 11.6*  HCT 39.7 38.9 34.9*  PLT 222 230 220    COAGS: No results for input(s): INR, APTT in the last 8760 hours.  BMP: Recent Labs    06/15/21 0841 06/17/21 1046 06/18/21 0420  NA 136 138 138  K 3.5 3.4* 3.9  CL 103 103 104  CO2 $Re'23 25 26  'OZF$ GLUCOSE 148* 100* 90  BUN $Re'10 11 9  'zSI$ CALCIUM 8.9 8.9 8.6*  CREATININE 0.53 0.70 0.79  GFRNONAA >60 >60 >60    LIVER FUNCTION TESTS: Recent Labs    06/15/21 0841 06/17/21 1046 06/18/21 0420  BILITOT 0.6 0.7 0.6  AST  $Re'21 20 17  'oKc$ ALT 33 27 22  ALKPHOS 94 79 67  PROT 7.0 7.3 6.2*  ALBUMIN 4.2 4.0 3.4*    Assessment and Plan:  50 y.o. female with large complex hepatic cyst s/p aspiration and drain placement by Dt. Suttle on 12/29.   Cx pending  No cbc/cmp obtained today   Drain Location: RUQ Size: Fr size: 10 Fr Date of placement: 12/29  Currently to: Drain collection device: gravity 24 hour output:  Output by Drain (mL) 06/17/21 0701 - 06/17/21 1900 06/17/21 1901 - 06/18/21 0700 06/18/21 0701 - 06/18/21 1900 06/18/21 1901 - 06/19/21 0700 06/19/21 0701 - 06/19/21 0929  Closed System Drain 1 Lateral RUQ Other (Comment) 10 Fr.   1300 30     Interval imaging/drain manipulation:  None   Current examination: Aspirates easily, flushed with resistance - do not force to flush  Insertion site unremarkable. Suture and stat lock in place. Dressed appropriately.   Plan: Continue TID flushes with 5 cc NS. Record output Q shift. Dressing changes QD or PRN if soiled.  Call IR APP or on call IR MD if difficulty flushing or sudden change in drain output.  Repeat imaging/possible drain injection once output < 10 mL/QD (excluding flush material.)  Discharge planning: Patient to be scheduled for drain exchange and possible sclerotherapy for the hepatic cyst with Dr. Serafina Royals as an outpatient.   Patient will received a call from IR schedulers. Patient to flush the drain with 5 cc NS daily and record OP daily.  Dressing change when soiled or at least once in 2-3 days.  Patient to call IR if OP changes drastically or develops RUQ pain, fever, N/V, chills, malaise.   IR will continue to follow - please call with questions or concerns.   Electronically Signed: Tera Mater, PA-C 06/19/2021, 9:28 AM   I spent a total of 15 Minutes at the the patient's bedside AND on the patient's hospital floor or unit, greater than 50% of which was counseling/coordinating care for hepatic cyst drain.   This chart was  dictated using voice recognition software.  Despite best efforts to proofread,  errors can occur which can change the documentation meaning.

## 2021-06-20 LAB — COMPREHENSIVE METABOLIC PANEL
ALT: 17 U/L (ref 0–44)
AST: 15 U/L (ref 15–41)
Albumin: 3.7 g/dL (ref 3.5–5.0)
Alkaline Phosphatase: 74 U/L (ref 38–126)
Anion gap: 9 (ref 5–15)
BUN: 12 mg/dL (ref 6–20)
CO2: 23 mmol/L (ref 22–32)
Calcium: 8.9 mg/dL (ref 8.9–10.3)
Chloride: 105 mmol/L (ref 98–111)
Creatinine, Ser: 0.5 mg/dL (ref 0.44–1.00)
GFR, Estimated: >60 mL/min (ref >60)
Glucose, Bld: 122 mg/dL — ABNORMAL HIGH (ref 70–99)
Potassium: 3.7 mmol/L (ref 3.5–5.1)
Sodium: 137 mmol/L (ref 135–145)
Total Bilirubin: 0.7 mg/dL (ref 0.3–1.2)
Total Protein: 7.3 g/dL (ref 6.5–8.1)

## 2021-06-20 LAB — CBC
HCT: 41.1 % (ref 36.0–46.0)
Hemoglobin: 13.4 g/dL (ref 12.0–15.0)
MCH: 28.5 pg (ref 26.0–34.0)
MCHC: 32.6 g/dL (ref 30.0–36.0)
MCV: 87.4 fL (ref 80.0–100.0)
Platelets: 297 10*3/uL (ref 150–400)
RBC: 4.7 MIL/uL (ref 3.87–5.11)
RDW: 12.3 % (ref 11.5–15.5)
WBC: 10.9 10*3/uL — ABNORMAL HIGH (ref 4.0–10.5)
nRBC: 0 % (ref 0.0–0.2)

## 2021-06-20 MED ORDER — BISACODYL 10 MG RE SUPP
10.0000 mg | Freq: Once | RECTAL | Status: AC
  Administered 2021-06-21: 10 mg via RECTAL

## 2021-06-20 MED ORDER — HYDROMORPHONE HCL 1 MG/ML IJ SOLN: 0.5000 mg | Freq: Four times a day (QID) | INTRAMUSCULAR | Status: DC | PRN

## 2021-06-20 MED ORDER — LACTULOSE 10 GM/15ML PO SOLN
20.0000 g | Freq: Once | ORAL | Status: AC
  Administered 2021-06-20: 20 g via ORAL
  Filled 2021-06-20: qty 30

## 2021-06-20 MED ORDER — BISACODYL 5 MG PO TBEC
10.0000 mg | DELAYED_RELEASE_TABLET | Freq: Every day | ORAL | Status: DC
  Administered 2021-06-20 – 2021-06-22 (×3): 10 mg via ORAL
  Filled 2021-06-20 (×3): qty 2

## 2021-06-20 MED ORDER — METHOCARBAMOL 500 MG PO TABS
500.0000 mg | ORAL_TABLET | Freq: Once | ORAL | Status: AC
  Administered 2021-06-20: 500 mg via ORAL
  Filled 2021-06-20: qty 1

## 2021-06-20 MED ORDER — POLYETHYLENE GLYCOL 3350 17 G PO PACK
17.0000 g | PACK | Freq: Every day | ORAL | Status: DC
Start: 2021-06-20 — End: 2021-06-22
  Administered 2021-06-20 – 2021-06-22 (×3): 17 g via ORAL
  Filled 2021-06-20 (×3): qty 1

## 2021-06-20 NOTE — Progress Notes (Signed)
Megan Wilkins

## 2021-06-20 NOTE — Progress Notes (Signed)
PROGRESS NOTE    Megan Wilkins  HFW:[id number] DOB: 1970/11/04 DOA: 06/17/2021 PCP: Sigmund Hazel, MD   Brief Narrative:50 y.o. female with history of hypertension presents to the ED for evaluation of worsening right upper quadrant abdominal pain.  Underwent MRI of the abdomen that showed 13.2 complex cystic lesion in the right hepatic lobe probably benign characteristic, abscess is considered unlikely however biliary cystadenoma cannot be excluded.  Patient received IV Dilaudid IV fluid bolus and Zofran for pain control . GI and surgery was consulted GI advised to consult with surgery for possible resection given significant pain.  Dr. Freida Busman from surgery was consulted and he advised IR to drain if patient has recurrence symptoms,  surgery can be involved.   IR was consulted, underwent CT guided hepatic cyst aspiration and drainage.  1.4 lit of thin, brown colored fluid aspirated and a pig tail drain placed into hepatic cyst. Samples sent for cytology and culture.   Assessment & Plan:   Principal Problem:   Hepatic cyst Active Problems:   Menopause   #1 right upper quadrant abdominal pain due to 13.2 cm complex cystic lesion in the right hepatic lobe.  Status post drainage and aspiration by IR and placement of drain on 06/18/2021. A 1.4 L of thin brown-colored fluid aspirated and a pigtail drain placed to the hepatic cyst. Appreciate IR input.  Patient to follow-up with IR as an outpatient for drain exchange and possible sclerotherapy for the cyst with Dr. Lenn Sink.  Flush the drain with 5 cc normal saline daily and record the output daily.  Change the dressing when soiled or at least once in 2 to 3 days. Patient has been requiring IV Dilaudid overnight multiple times.  Continue antiemetics.  She continues complain of spasm in the right upper quadrant and has been needing narcotics and antispasmodic agents. enourage activity, out of bed, ambulate in hallway. Taper IV narcotics down  #2  hypertension blood pressure 134/77 better. continue lisinopril 10 mg daily  #3 seasonal allergies on Claritin Singulair and Flonase  #4 depression on Lexapro  #5 constipation we will start stool softeners and laxatives get her out of bed ambulate in hallway hope to discharge her home tomorrow with family.     Estimated body mass index is 26.57 kg/m as calculated from the following:   Height as of this encounter: 5\' 3"  (1.6 m).   Weight as of this encounter: 68 kg.  DVT prophylaxis: Lovenox  code Status: Full code  family Communication: None at bedside  disposition Plan:  Status is: Inpatient  Remains inpatient appropriate because: Patient's is in severe pain requiring IV narcotics   Consultants:  Interventional radiology  Procedures: Status post aspiration of liver cyst 06/18/2021 Antimicrobials: None  Subjective: Continues to complain of right upper quadrant spasm and pain has been nauseous Anxious feels she is not ready to go home today.  She has not had a bowel movement since 25 December. Objective: Vitals:   06/19/21 1005 06/19/21 1346 06/19/21 2141 06/20/21 0700  BP: (!) 163/85 (!) 166/81 (!) 148/81 134/77  Pulse: 86 95 95 76  Resp: 18 16 18 18   Temp: 98.1 F (36.7 C) 99.5 F (37.5 C) 99.8 F (37.7 C) 98 F (36.7 C)  TempSrc: Oral Oral Oral Oral  SpO2: 96% 93% 91% 91%  Weight:      Height:        Intake/Output Summary (Last 24 hours) at 06/20/2021 1224 Last data filed at 06/20/2021 1100 Gross per 24 hour  Intake 2088.79 ml  Output 2805 ml  Net -716.21 ml    Filed Weights   06/17/21 1900  Weight: 68 kg    Examination:  General exam: Appears in mild distress due to pain Respiratory system: Clear to auscultation. Respiratory effort normal. Cardiovascular system: S1 & S2 heard, RRR. No JVD, murmurs, rubs, gallops or clicks. No pedal edema. Gastrointestinal system: Abdomen is nondistended, soft and right upper quadrant tenderness drains in place No  organomegaly or masses felt. Normal bowel sounds heard. Central nervous system: Alert and oriented. No focal neurological deficits. Extremities: Symmetric 5 x 5 power. Skin: No rashes, lesions or ulcers Psychiatry: Judgement and insight appear normal. Mood & affect appropriate.     Data Reviewed: I have personally reviewed following labs and imaging studies  CBC: Recent Labs  Lab 06/15/21 0841 06/17/21 1046 06/18/21 0420  WBC 13.5* 8.1 7.4  NEUTROABS 12.1*  --   --   HGB 13.4 13.0 11.6*  HCT 39.7 38.9 34.9*  MCV 85.2 86.6 87.5  PLT 222 230 220    Basic Metabolic Panel: Recent Labs  Lab 06/15/21 0841 06/17/21 1046 06/18/21 0420  NA 136 138 138  K 3.5 3.4* 3.9  CL 103 103 104  CO2 GLUCOSE 148* 100* 90  BUN CREATININE 0.53 0.70 0.79  CALCIUM 8.9 8.9 8.6*    GFR: Estimated Creatinine Clearance: 77 mL/min (by C-G formula based on SCr of 0.79 mg/dL). Liver Function Tests: Recent Labs  Lab 06/15/21 0841 06/17/21 1046 06/18/21 0420  AST ALT 33 27 22  ALKPHOS 94 79 67  BILITOT 0.6 0.7 0.6  PROT 7.0 7.3 6.2*  ALBUMIN 4.2 4.0 3.4*    Recent Labs  Lab 06/15/21 0841 06/17/21 1046  LIPASE <10* 23    No results for input(s): AMMONIA in the last 168 hours. Coagulation Profile: No results for input(s): INR, PROTIME in the last 168 hours. Cardiac Enzymes: No results for input(s): CKTOTAL, CKMB, CKMBINDEX, TROPONINI in the last 168 hours. BNP (last 3 results) No results for input(s): PROBNP in the last 8760 hours. HbA1C: No results for input(s): HGBA1C in the last 72 hours. CBG: No results for input(s): GLUCAP in the last 168 hours. Lipid Profile: No results for input(s): CHOL, HDL, LDLCALC, TRIG, CHOLHDL, LDLDIRECT in the last 72 hours. Thyroid Function Tests: No results for input(s): TSH, T4TOTAL, FREET4, T3FREE, THYROIDAB in the last 72 hours. Anemia Panel: No results for input(s): VITAMINB12, FOLATE, FERRITIN, TIBC, IRON,  RETICCTPCT in the last 72 hours. Sepsis Labs: Recent Labs  Lab 06/17/21 1046  PROCALCITON <0.10     Recent Results (from the past 240 hour(s))  Resp Panel by RT-PCR (Flu A&B, Covid) Nasopharyngeal Swab     Status: None   Collection Time: 06/17/21 11:00 AM   Specimen: Nasopharyngeal Swab; Nasopharyngeal(NP) swabs in vial transport medium  Result Value Ref Range Status   SARS Coronavirus 2 by RT PCR NEGATIVE NEGATIVE Final    Comment: (NOTE) SARS-CoV-2 target nucleic acids are NOT DETECTED.  The SARS-CoV-2 RNA is generally detectable in upper respiratory specimens during the acute phase of infection. The lowest concentration of SARS-CoV-2 viral copies this assay can detect is 138 copies/mL. A negative result does not preclude SARS-Cov-2 infection and should not be used as the sole basis for treatment or other patient management decisions. A negative result may occur with  improper specimen collection/handling, submission of specimen other than nasopharyngeal swab, presence of  viral mutation(s) within the areas targeted by this assay, and inadequate number of viral copies(<138 copies/mL). A negative result must be combined with clinical observations, patient history, and epidemiological information. The expected result is Negative.  Fact Sheet for Patients:  BloggerCourse.com  Fact Sheet for Healthcare Providers:  SeriousBroker.it  This test is no t yet approved or cleared by the [place] FDA and  has been authorized for detection and/or diagnosis of SARS-CoV-2 by FDA under an Emergency Use Authorization (EUA). This EUA will remain  in effect (meaning this test can be used) for the duration of the COVID-19 declaration under Section 564(b)(1) of the Act, 21 U.S.C.section 360bbb-3(b)(1), unless the authorization is terminated  or revoked sooner.       Influenza A by PCR NEGATIVE NEGATIVE Final   Influenza B by PCR  NEGATIVE NEGATIVE Final    Comment: (NOTE) The Xpert Xpress SARS-CoV-2/FLU/RSV plus assay is intended as an aid in the diagnosis of influenza from Nasopharyngeal swab specimens and should not be used as a sole basis for treatment. Nasal washings and aspirates are unacceptable for Xpert Xpress SARS-CoV-2/FLU/RSV testing.  Fact Sheet for Patients: BloggerCourse.com  Fact Sheet for Healthcare Providers: SeriousBroker.it  This test is not yet approved or cleared by the [place] FDA and has been authorized for detection and/or diagnosis of SARS-CoV-2 by FDA under an Emergency Use Authorization (EUA). This EUA will remain in effect (meaning this test can be used) for the duration of the COVID-19 declaration under Section 564(b)(1) of the Act, 21 U.S.C. section 360bbb-3(b)(1), unless the authorization is terminated or revoked.  Performed at [facility], [address] [place], [place] [address]   Aerobic/Anaerobic Culture w Gram Stain (surgical/deep wound)     Status: None (Preliminary result)   Collection Time: 06/18/21 12:43 PM   Specimen: Liver; Abscess  Result Value Ref Range Status   Specimen Description   Final    LIVER Performed at [facility], [address] [place], [place] [address]    Special Requests   Final    NONE Performed at [facility], [address], [place], [place] [address]    Gram Stain   Final    MODERATE WBC PRESENT,BOTH PMN AND MONONUCLEAR NO ORGANISMS SEEN    Culture   Final    NO GROWTH 2 DAYS Performed at [facility], [address] [place], [place] [address]    Report Status PENDING  Incomplete          Radiology Studies: CT IMAGE GUIDED DRAINAGE BY PERCUTANEOUS CATHETER  Result Date: 06/18/2021 INDICATION: 50 year old female with history of symptomatic hepatic cyst. EXAM: CT IMAGE GUIDED DRAINAGE BY PERCUTANEOUS  CATHETER COMPARISON:  06/17/2021 MEDICATIONS: The patient is currently admitted to the hospital and receiving intravenous antibiotics. The antibiotics were administered within an appropriate time frame prior to the initiation of the procedure. ANESTHESIA/SEDATION: Moderate (conscious) sedation was employed during this procedure. A total of Versed 4 mg and Fentanyl 200 mcg was administered intravenously. Moderate Sedation Time: 26 minutes. The patient's level of consciousness and vital signs were monitored continuously by radiology nursing throughout the procedure under my direct supervision. CONTRAST:  None COMPLICATIONS: None immediate. PROCEDURE: Informed written consent was obtained from the patient after a discussion of the risks, benefits and alternatives to treatment. The patient was placed supine on the CT gantry and a pre procedural CT was performed re-demonstrating the known abscess/fluid collection within the right lobe of  the liver. The procedure was planned. A timeout was performed prior to the initiation of the procedure. The right upper quadrant was prepped and draped in the usual sterile fashion. The overlying soft tissues were anesthetized with normal saline. Appropriate trajectory was planned with the use of a 22 gauge spinal needle. An 18 gauge trocar needle was advanced into the abscess/fluid collection and a short Amplatz super stiff wire was coiled within the collection. Appropriate positioning was confirmed with a limited CT scan. The tract was serially dilated allowing placement of a 10.2 [place] all-purpose drainage catheter. Appropriate positioning was confirmed with a limited postprocedural CT scan. 1,400 ml of thin, brown colored fluid was aspirated. The tube was connected to a drainage bag and sutured in place. A dressing was placed. The patient tolerated the procedure well without immediate post procedural complication. IMPRESSION: Successful CT guided placement of a 10.2 [place] all  purpose drain catheter into the right hepatic cyst with aspiration of 1.4 L of thin, brown fluid. Samples were sent to the laboratory for culture and cytology. PLAN: Return in 2-3 weeks for drain check and consideration of sclerotherapy. Marliss Coots, MD [facility] Radiology Electronically Signed   By: Marliss Coots M.D.   On: 06/18/2021 14:36        Scheduled Meds:  Azelastine-Fluticasone  1 spray Each Nare BID   bisacodyl  10 mg Oral Daily   bisacodyl  10 mg Rectal Once   escitalopram  20 mg Oral QPM   lisinopril  10 mg Oral QPM   loratadine  10 mg Oral QPM   montelukast  10 mg Oral QPM   multivitamin with minerals  1 tablet Oral Daily   norelgestromin-ethinyl estradiol  1 patch Transdermal Weekly   polyethylene glycol  17 g Oral Daily   sodium chloride flush  5 mL Intracatheter Q8H   Continuous Infusions:     LOS: 3 days    Time spent: 38 minutes  Alwyn Ren, MD 06/20/2021, 12:24 PM

## 2021-06-21 LAB — CYTOLOGY - NON PAP

## 2021-06-21 MED ORDER — SORBITOL 70 % SOLN
300.0000 mL | TOPICAL_OIL | Freq: Once | ORAL | Status: DC
  Filled 2021-06-21: qty 90

## 2021-06-21 MED ORDER — HYDROMORPHONE HCL 1 MG/ML IJ SOLN: 0.5000 mg | Freq: Two times a day (BID) | INTRAMUSCULAR | Status: DC | PRN

## 2021-06-21 MED ORDER — BISACODYL 10 MG RE SUPP
10.0000 mg | Freq: Once | RECTAL | Status: AC
  Administered 2021-06-21: 10 mg via RECTAL
  Filled 2021-06-21: qty 1

## 2021-06-21 NOTE — Plan of Care (Signed)
  Problem: Pain Managment: Goal: General experience of comfort will improve Outcome: Progressing   

## 2021-06-21 NOTE — Progress Notes (Signed)
PROGRESS NOTE    Megan Wilkins  [id number] DOB: 03-04-1971 DOA: 06/17/2021 PCP: Kathyrn Lass, MD   Brief Narrative:50 y.o. female with history of hypertension presents to the ED for evaluation of worsening right upper quadrant abdominal pain.  Underwent MRI of the abdomen that showed 13.2 complex cystic lesion in the right hepatic lobe probably benign characteristic, abscess is considered unlikely however biliary cystadenoma cannot be excluded.  Patient received IV Dilaudid IV fluid bolus and Zofran for pain control . GI and surgery was consulted GI advised to consult with surgery for possible resection given significant pain.  Dr. Zenia Resides from surgery was consulted and he advised IR to drain if patient has recurrence symptoms,  surgery can be involved.   IR was consulted, underwent CT guided hepatic cyst aspiration and drainage.  1.4 lit of thin, brown colored fluid aspirated and a pig tail drain placed into hepatic cyst. Samples sent for cytology and culture.   Assessment & Plan:   Principal Problem:   Hepatic cyst Active Problems:   Menopause   #1 right upper quadrant abdominal pain due to 13.2 cm complex cystic lesion in the right hepatic lobe.  Status post drainage and aspiration by IR and placement of drain on 06/18/2021.  Samples show no malignant cells.  No growth so far. A 1.4 L of thin brown-colored fluid aspirated and a pigtail drain placed to the hepatic cyst. Appreciate IR input.  Patient to follow-up with IR as an outpatient for drain exchange and possible sclerotherapy for the cyst with Dr. Hinda Glatter.  Flush the drain with 5 cc normal saline daily and record the output daily.  Change the dressing when soiled or at least once in 2 to 3 days. Patient has been requiring IV Dilaudid overnight multiple times.  Continue antiemetics.  She continues complain of spasm in the right upper quadrant and has been needing narcotics and antispasmodic agents. enourage activity, out of bed,  ambulate in hallway. Taper IV narcotics down  #2 hypertension blood pressure 134/77 better. continue lisinopril 10 mg daily  #3 seasonal allergies on Claritin Singulair and Flonase  #4 depression on Lexapro  #5 constipation she has not had a bowel movement over 1 week.  She has received suppositories, laxatives and stool softeners still has not had a BM.  Will attempt smog enema.  She is complaining of severe cramps in her abdomen.  When she has a bowel movement I will plan on discharging her home.     Estimated body mass index is 26.57 kg/m as calculated from the following:   Height as of this encounter: 5\' 3"  (1.6 m).   Weight as of this encounter: 68 kg.  DVT prophylaxis: Lovenox  code Status: Full code  family Communication: None at bedside  disposition Plan:  Status is: Inpatient  Remains inpatient appropriate because: Patient's is in severe pain requiring IV narcotics   Consultants:  Interventional radiology  Procedures: Status post aspiration of liver cyst 06/18/2021 Antimicrobials: None  Subjective: Patient continues to complain of constipation no having a BM for a week.  Complaining of severe abdominal cramps and right upper quadrant spasms does not feel she is ready to go home. Objective: Vitals:   06/20/21 1342 06/20/21 2152 06/21/21 0509 06/21/21 1342  BP: 133/85 131/82 124/72 131/76  Pulse: 90 92 73 91  Resp: 16 16 16 18   Temp: 98.3 F (36.8 C) 98.3 F (36.8 C) 97.9 F (36.6 C) 98.6 F (37 C)  TempSrc:  Oral Oral  SpO2: 97% 92% 93% 94%  Weight:      Height:        Intake/Output Summary (Last 24 hours) at 06/21/2021 1430 Last data filed at 06/21/2021 1000 Gross per 24 hour  Intake 857 ml  Output 3315 ml  Net -2458 ml    Filed Weights   06/17/21 1900  Weight: 68 kg    Examination:  General exam: Appears in mild distress due to pain Respiratory system: Clear to auscultation. Respiratory effort normal. Cardiovascular system: S1 & S2 heard, RRR.  No JVD, murmurs, rubs, gallops or clicks. No pedal edema. Gastrointestinal system: Abdomen is nondistended, soft and right upper quadrant tenderness drains in place No organomegaly or masses felt. Normal bowel sounds heard.  Drain  in place right upper quadrant Central nervous system: Alert and oriented. No focal neurological deficits. Extremities: Symmetric 5 x 5 power. Skin: No rashes, lesions or ulcers Psychiatry: Judgement and insight appear normal. Mood & affect appropriate.     Data Reviewed: I have personally reviewed following labs and imaging studies  CBC: Recent Labs  Lab 06/15/21 0841 06/17/21 1046 06/18/21 0420 06/20/21 1250  WBC 13.5* 8.1 7.4 10.9*  NEUTROABS 12.1*  --   --   --   HGB 13.4 13.0 11.6* 13.4  HCT 39.7 38.9 34.9* 41.1  MCV 85.2 86.6 87.5 87.4  PLT 222 230 220 [id number]    Basic Metabolic Panel: Recent Labs  Lab 06/15/21 0841 06/17/21 1046 06/18/21 0420 06/20/21 1250  NA 136 138 138 137  K 3.5 3.4* 3.9 3.7  CL 103 103 104 105  CO2 23 25 26 23   GLUCOSE 148* 100* 90 122*  BUN 10 11 9 12   CREATININE 0.53 0.70 0.79 0.50  CALCIUM 8.9 8.9 8.6* 8.9    GFR: Estimated Creatinine Clearance: 77 mL/min (by C-G formula based on SCr of 0.5 mg/dL). Liver Function Tests: Recent Labs  Lab 06/15/21 0841 06/17/21 1046 06/18/21 0420 06/20/21 1250  AST 21 20 17 15   ALT 33 27 22 17   ALKPHOS 94 79 67 74  BILITOT 0.6 0.7 0.6 0.7  PROT 7.0 7.3 6.2* 7.3  ALBUMIN 4.2 4.0 3.4* 3.7    Recent Labs  Lab 06/15/21 0841 06/17/21 1046  LIPASE <10* 23    No results for input(s): AMMONIA in the last 168 hours. Coagulation Profile: No results for input(s): INR, PROTIME in the last 168 hours. Cardiac Enzymes: No results for input(s): CKTOTAL, CKMB, CKMBINDEX, TROPONINI in the last 168 hours. BNP (last 3 results) No results for input(s): PROBNP in the last 8760 hours. HbA1C: No results for input(s): HGBA1C in the last 72 hours. CBG: No results for input(s):  GLUCAP in the last 168 hours. Lipid Profile: No results for input(s): CHOL, HDL, LDLCALC, TRIG, CHOLHDL, LDLDIRECT in the last 72 hours. Thyroid Function Tests: No results for input(s): TSH, T4TOTAL, FREET4, T3FREE, THYROIDAB in the last 72 hours. Anemia Panel: No results for input(s): VITAMINB12, FOLATE, FERRITIN, TIBC, IRON, RETICCTPCT in the last 72 hours. Sepsis Labs: Recent Labs  Lab 06/17/21 1046  PROCALCITON <0.10     Recent Results (from the past 240 hour(s))  Resp Panel by RT-PCR (Flu A&B, Covid) Nasopharyngeal Swab     Status: None   Collection Time: 06/17/21 11:00 AM   Specimen: Nasopharyngeal Swab; Nasopharyngeal(NP) swabs in vial transport medium  Result Value Ref Range Status   SARS Coronavirus 2 by RT PCR NEGATIVE NEGATIVE Final    Comment: (NOTE) SARS-CoV-2 target nucleic acids are NOT  DETECTED.  The SARS-CoV-2 RNA is generally detectable in upper respiratory specimens during the acute phase of infection. The lowest concentration of SARS-CoV-2 viral copies this assay can detect is 138 copies/mL. A negative result does not preclude SARS-Cov-2 infection and should not be used as the sole basis for treatment or other patient management decisions. A negative result may occur with  improper specimen collection/handling, submission of specimen other than nasopharyngeal swab, presence of viral mutation(s) within the areas targeted by this assay, and inadequate number of viral copies(<138 copies/mL). A negative result must be combined with clinical observations, patient history, and epidemiological information. The expected result is Negative.  Fact Sheet for Patients:  EntrepreneurPulse.com.au  Fact Sheet for Healthcare Providers:  IncredibleEmployment.be  This test is no t yet approved or cleared by the [place] FDA and  has been authorized for detection and/or diagnosis of SARS-CoV-2 by FDA under an Emergency Use  Authorization (EUA). This EUA will remain  in effect (meaning this test can be used) for the duration of the COVID-19 declaration under Section 564(b)(1) of the Act, 21 U.S.C.section 360bbb-3(b)(1), unless the authorization is terminated  or revoked sooner.       Influenza A by PCR NEGATIVE NEGATIVE Final   Influenza B by PCR NEGATIVE NEGATIVE Final    Comment: (NOTE) The Xpert Xpress SARS-CoV-2/FLU/RSV plus assay is intended as an aid in the diagnosis of influenza from Nasopharyngeal swab specimens and should not be used as a sole basis for treatment. Nasal washings and aspirates are unacceptable for Xpert Xpress SARS-CoV-2/FLU/RSV testing.  Fact Sheet for Patients: EntrepreneurPulse.com.au  Fact Sheet for Healthcare Providers: IncredibleEmployment.be  This test is not yet approved or cleared by the [place] FDA and has been authorized for detection and/or diagnosis of SARS-CoV-2 by FDA under an Emergency Use Authorization (EUA). This EUA will remain in effect (meaning this test can be used) for the duration of the COVID-19 declaration under Section 564(b)(1) of the Act, 21 U.S.C. section 360bbb-3(b)(1), unless the authorization is terminated or revoked.  Performed at [facility], [place] [address], [place], [place] [address]   Aerobic/Anaerobic Culture w Gram Stain (surgical/deep wound)     Status: None (Preliminary result)   Collection Time: 06/18/21 12:43 PM   Specimen: Liver; Abscess  Result Value Ref Range Status   Specimen Description   Final    LIVER Performed at [place] [address] [place], [place] [address]    Special Requests   Final    NONE Performed at [facility], [place] [address], [place], [place] [address]    Gram Stain   Final    MODERATE WBC PRESENT,BOTH PMN AND MONONUCLEAR NO ORGANISMS SEEN    Culture   Final    NO GROWTH 3 DAYS NO ANAEROBES  ISOLATED; CULTURE IN PROGRESS FOR 5 DAYS Performed at [facility], [place] [address], [place], [place] [address]    Report Status PENDING  Incomplete          Radiology Studies: No results found.      Scheduled Meds:  Azelastine-Fluticasone  1 spray Each Nare BID   bisacodyl  10 mg Oral Daily   escitalopram  20 mg Oral QPM   lisinopril  10 mg Oral QPM   loratadine  10 mg Oral QPM   montelukast  10 mg Oral QPM   multivitamin with minerals  1 tablet Oral Daily   norelgestromin-ethinyl estradiol  1 patch Transdermal Weekly  polyethylene glycol  17 g Oral Daily   sodium chloride flush  5 mL Intracatheter Q8H   sorbitol, milk of mag, mineral oil, glycerin (SMOG) enema  300 mL Rectal Once   Continuous Infusions:     LOS: 4 days    Time spent: 38 minutes  Georgette Shell, MD 06/21/2021, 2:30 PM

## 2021-06-22 MED ORDER — OXYCODONE HCL 5 MG PO TABS: 5.0000 mg | ORAL_TABLET | Freq: Four times a day (QID) | ORAL | 0 refills | Status: AC | PRN

## 2021-06-22 MED ORDER — POLYETHYLENE GLYCOL 3350 17 G PO PACK: 17.0000 g | PACK | Freq: Every day | ORAL | 0 refills | Status: AC

## 2021-06-22 MED ORDER — BISACODYL 5 MG PO TBEC: 10.0000 mg | DELAYED_RELEASE_TABLET | Freq: Every day | ORAL | 0 refills | Status: AC

## 2021-06-22 MED ORDER — ONDANSETRON HCL 4 MG PO TABS: 4.0000 mg | ORAL_TABLET | Freq: Four times a day (QID) | ORAL | 0 refills | Status: AC | PRN

## 2021-06-22 MED ORDER — CYCLOBENZAPRINE HCL 5 MG PO TABS: 5.0000 mg | ORAL_TABLET | Freq: Three times a day (TID) | ORAL | 0 refills | Status: AC | PRN

## 2021-06-22 NOTE — Progress Notes (Signed)
Patient refused smog enema. She stated that she has not eaten very much in the last few days, and  reports not feeling constipated patient currently having abdominal spasms. Patient did report having several small bowel movements, some which to be liquid. I did educate patient on the importance of the enema and the process of administration. Patient stated that she would let staff know if she were to change her mind on receiving the enema. Will continue to monitor.

## 2021-06-22 NOTE — Progress Notes (Signed)
Megan Wilkins to be D/C'd home per MD order. Discussed with the patient, husband, an daughter, and all questions fully answered.  Skin clean, dry and intact without evidence of skin break down, no evidence of skin tears noted.  IV catheter discontinued intact. Site without signs and symptoms of complications. Dressing and pressure applied. Family successfully educated on changing drain dressing, emptying, and measuring output. Supplies sent home with patient. An After Visit Summary was printed and given to the patient.  Patient escorted via WC, and D/C home via private auto.  [name]  06/22/2021 1:00 PM

## 2021-06-23 LAB — AEROBIC/ANAEROBIC CULTURE W GRAM STAIN (SURGICAL/DEEP WOUND): Culture: NO GROWTH

## 2021-06-23 NOTE — Discharge Summary (Signed)
Physician Discharge Summary  Megan Wilkins CVE:[id number] DOB: 23-Nov-1970 DOA: 06/17/2021  PCP: Sigmund Hazel, MD  Admit date: 06/17/2021 Discharge date: 06/23/2021  Admitted From: home Disposition:  home  Recommendations for Outpatient Follow-up:  Follow up with PCP in 1-2 weeks Please obtain BMP/CBC in one week Please follow up on the culture from the liver drainage as of now there is no growth and no malignant cells  Home Health:none Equipment/Devices:none Discharge Condition:stable CODE STATUS:full code Diet recommendation: cardiac  Brief/Interim Summary:50 y.o. female with history of hypertension presents to the ED for evaluation of worsening right upper quadrant abdominal pain.  Underwent MRI of the abdomen that showed 13.2 complex cystic lesion in the right hepatic lobe probably benign characteristic, abscess is considered unlikely however biliary cystadenoma cannot be excluded.  Patient received IV Dilaudid IV fluid bolus and Zofran for pain control . GI and surgery was consulted GI advised to consult with surgery for possible resection given significant pain.  Dr. Freida Busman from surgery was consulted and he advised IR to drain if patient has recurrence symptoms,  surgery can be involved.   IR was consulted, underwent CT guided hepatic cyst aspiration and drainage.  1.4 lit of thin, brown colored fluid aspirated and a pig tail drain placed into hepatic cyst. Samples sent for cytology and culture   Discharge Diagnoses:  Principal Problem:   Hepatic cyst Active Problems:   Menopause      #1 right upper quadrant abdominal pain due to 13.2 cm complex cystic lesion in the right hepatic lobe.  Status post drainage and aspiration by IR and placement of drain on 06/18/2021.  Samples show no malignant cells.  No growth so far. A 1.4 L of thin brown-colored fluid aspirated and a pigtail drain placed to the hepatic cyst.  Patient to follow-up with IR as an outpatient for drain exchange  and possible sclerotherapy for the cyst with Dr. Lenn Sink.  Flush the drain with 5 cc normal saline daily and record the output daily.  Change the dressing when soiled or at least once in 2 to 3 days.   #2 hypertension  continue lisinopril 10 mg daily   #3 seasonal allergies on Claritin Singulair and Flonase   #4 depression on Lexapro   #5 constipation resolved    Estimated body mass index is 26.57 kg/m as calculated from the following:   Height as of this encounter: 5\' 3"  (1.6 m).   Weight as of this encounter: 68 kg.  Discharge Instructions  Discharge Instructions     Diet - low sodium heart healthy   Complete by: As directed    Discharge wound care:   Complete by: As directed    Flush the drain with 5 cc normal saline daily and record the output daily.  Change dressing when soiled or at least once in 2 days.  Please call interventional radiology at [facility] if fever right upper quadrant pain nausea vomiting chills malaise.   Increase activity slowly   Complete by: As directed       Allergies as of 06/22/2021       Reactions   Aspercreme [trolamine Salicylate] Swelling   Novocain [procaine] Swelling        Medication List     STOP taking these medications    lisinopril 10 MG tablet Commonly known as: ZESTRIL   oxyCODONE-acetaminophen 5-325 MG tablet Commonly known as: PERCOCET/ROXICET       TAKE these medications    acetaminophen 500 MG tablet Commonly known  as: TYLENOL Take 1,000 mg by mouth every 6 (six) hours as needed for mild pain.   Azelastine-Fluticasone 137-50 MCG/ACT Susp Place 1 spray into both nostrils 2 (two) times daily.   bisacodyl 5 MG EC tablet Commonly known as: DULCOLAX Take 2 tablets (10 mg total) by mouth daily.   cyclobenzaprine 5 MG tablet Commonly known as: FLEXERIL Take 1 tablet (5 mg total) by mouth 3 (three) times daily as needed for muscle spasms.   escitalopram 20 MG tablet Commonly known as: LEXAPRO Take 20 mg by  mouth every evening.   levocetirizine 5 MG tablet Commonly known as: XYZAL Take 5 mg by mouth every evening.   montelukast 10 MG tablet Commonly known as: SINGULAIR Take 10 mg by mouth every evening.   multivitamin tablet Take 1 tablet by mouth daily.   ondansetron 4 MG tablet Commonly known as: ZOFRAN Take 1 tablet (4 mg total) by mouth every 6 (six) hours as needed for nausea. What changed:  when to take this reasons to take this   oxyCODONE 5 MG immediate release tablet Commonly known as: Oxy IR/ROXICODONE Take 1 tablet (5 mg total) by mouth every 6 (six) hours as needed for up to 7 days for moderate pain.   polyethylene glycol 17 g packet Commonly known as: MIRALAX / GLYCOLAX Take 17 g by mouth daily.   ProAir RespiClick 108 (90 Base) MCG/ACT Aepb Generic drug: Albuterol Sulfate Inhale 1 puff into the lungs every 6 (six) hours as needed for wheezing or shortness of breath.   SLOW FE PO Take 1 tablet by mouth daily.   Zafemy 150-35 MCG/24HR transdermal patch Generic drug: norelgestromin-ethinyl estradiol Place 1 patch onto the skin once a week.               Discharge Care Instructions  (From admission, onward)           Start     Ordered   06/22/21 0000  Discharge wound care:       Comments: Flush the drain with 5 cc normal saline daily and record the output daily.  Change dressing when soiled or at least once in 2 days.  Please call interventional radiology at [facility] if fever right upper quadrant pain nausea vomiting chills malaise.   06/22/21 1050            Follow-up Information     Fritzi Mandes, MD. Schedule an appointment as soon as possible for a visit.   Specialty: General Surgery Why: To discuss fenestration of hepatic cyst Contact information: [address] [place] [address] [phone]         Sigmund Hazel, MD Follow up.   Specialty: Family Medicine Contact information: [address] [place] [address] [phone]         Bennie Dallas, MD Follow up.   Specialties: Interventional Radiology, Diagnostic Radiology, Radiology Contact information: [address] [place] [address] [phone]                Allergies  Allergen Reactions   Aspercreme [Trolamine Salicylate] Swelling   Novocain [Procaine] Swelling    Consultations: IR   Procedures/Studies: CT Angio Chest PE W and/or Wo Contrast  Result Date: 06/15/2021 CLINICAL DATA:  Pulmonary embolism suspected. Positive D-dimer. Right-sided pain. EXAM: CT ANGIOGRAPHY CHEST WITH CONTRAST TECHNIQUE: Multidetector CT imaging of the chest was performed using the standard protocol during bolus administration of intravenous contrast. Multiplanar CT image  reconstructions and MIPs were obtained to evaluate the vascular anatomy. CONTRAST:  75mL OMNIPAQUE IOHEXOL 350 MG/ML SOLN COMPARISON:  Chest radiography same day FINDINGS: Cardiovascular: Borderline cardiomegaly. Small amount of pericardial fluid in the superior recess. No coronary artery calcification. Mild ectasia of the aorta. Maximal diameter of the ascending aorta is 3.4 cm. Pulmonary arterial opacification is good. There are no pulmonary emboli. Mediastinum/Nodes: No mediastinal or hilar mass or adenopathy. Lungs/Pleura: Small right pleural effusion layering dependently. Patchy infiltrate in both lower lobes, more extensive on the right than the left, consistent with bronchopneumonia. No lobar consolidation or collapse. Upper lobes are clear. Upper Abdomen: Massive cyst within the right lobe of the liver measuring up to 14 cm in diameter. Adjacent cyst at the inferior margin of the larger cyst measuring up to 5 cm in diameter. Several other scattered small cysts within the liver. Musculoskeletal: Normal Review of the MIP images confirms the above findings. IMPRESSION: No pulmonary emboli. Small pleural effusion on the right. Patchy  infiltrate and volume loss in both lower lobes consistent with bronchopneumonia. No dense consolidation or lobar collapse. Mild ectasia of the aorta but without evidence of frank aneurysm or atherosclerotic calcification. Maximal diameter of the ascending aorta is 3.4 cm. No follow-up imaging recommended at this point. Multiple liver cysts, including a massive cyst in the right lobe measuring up to 14 cm in diameter. Electronically Signed   By: Paulina FusiMark  Shogry M.D.   On: 06/15/2021 10:02   MR Abdomen W or Wo Contrast  Result Date: 06/17/2021 CLINICAL DATA:  Complex cystic liver lesion on recent ultrasound. EXAM: MRI ABDOMEN WITHOUT AND WITH CONTRAST TECHNIQUE: Multiplanar multisequence MR imaging of the abdomen was performed both before and after the administration of intravenous contrast. CONTRAST:  6mL GADAVIST GADOBUTROL 1 MMOL/ML IV SOLN COMPARISON:  None. FINDINGS: Lower chest: Small right and tiny left pleural effusions are seen. Bibasilar atelectasis is noted. Hepatobiliary: Several simple cysts are seen in both the right and left hepatic lobes, largest in the anterior right lobe measuring 5 cm. A large complex cystic lesion is seen involving the anterior and posterior segments of the right lobe, which measures 13.2 x 12.9 cm. This lesion shows T1 hyperintense fluid and multiple thin irregular internal septations, some showing mild contrast enhancement on subtraction imaging. No solid nodular components identified. No evidence of edema in adjacent hepatic parenchyma. No solid hepatic masses are identified. Gallbladder is unremarkable. No evidence of biliary ductal dilatation. Pancreas:  No mass or inflammatory changes. Spleen:  Within normal limits in size and appearance. Adrenals/Urinary Tract: No masses identified. No evidence of hydronephrosis. Stomach/Bowel: Visualized portion unremarkable. Vascular/Lymphatic: No pathologically enlarged lymph nodes identified. No acute vascular findings. Other:  None.  Musculoskeletal:  No suspicious bone lesions identified. IMPRESSION: 13.2 cm complex cystic lesion in right hepatic lobe, with nonspecific but probably benign characteristics. Abscess is considered unlikely, however biliary cystadenoma cannot be excluded. Small bilateral pleural effusions and bibasilar atelectasis. Electronically Signed   By: Danae OrleansJohn A Stahl M.D.   On: 06/17/2021 13:43   DG Chest Port 1 View  Result Date: 06/15/2021 CLINICAL DATA:  Right rib pain with no injury. EXAM: PORTABLE CHEST 1 VIEW COMPARISON:  None. FINDINGS: Numerous leads and wires project over the chest. Midline trachea. Normal heart size for level of inspiration. Mild to moderate right hemidiaphragm elevation. No pleural effusion or pneumothorax. Left base airspace disease. Minimal medial right lung base volume loss and atelectasis. IMPRESSION: Left base airspace disease, suspicious for pneumonia. Correlate with infectious  symptoms, given clinical history of right rib pain. Followup PA and lateral chest X-ray is recommended in 3-4 weeks following trial of antibiotic therapy to ensure resolution and exclude underlying malignancy. In addition, comparison with prior radiographs would be informative if available. Electronically Signed   By: Jeronimo GreavesKyle  Talbot M.D.   On: 06/15/2021 08:45   CT IMAGE GUIDED DRAINAGE BY PERCUTANEOUS CATHETER  Result Date: 06/18/2021 INDICATION: 51 year old female with history of symptomatic hepatic cyst. EXAM: CT IMAGE GUIDED DRAINAGE BY PERCUTANEOUS CATHETER COMPARISON:  06/17/2021 MEDICATIONS: The patient is currently admitted to the hospital and receiving intravenous antibiotics. The antibiotics were administered within an appropriate time frame prior to the initiation of the procedure. ANESTHESIA/SEDATION: Moderate (conscious) sedation was employed during this procedure. A total of Versed 4 mg and Fentanyl 200 mcg was administered intravenously. Moderate Sedation Time: 26 minutes. The patient's level of  consciousness and vital signs were monitored continuously by radiology nursing throughout the procedure under my direct supervision. CONTRAST:  None COMPLICATIONS: None immediate. PROCEDURE: Informed written consent was obtained from the patient after a discussion of the risks, benefits and alternatives to treatment. The patient was placed supine on the CT gantry and a pre procedural CT was performed re-demonstrating the known abscess/fluid collection within the right lobe of the liver. The procedure was planned. A timeout was performed prior to the initiation of the procedure. The right upper quadrant was prepped and draped in the usual sterile fashion. The overlying soft tissues were anesthetized with normal saline. Appropriate trajectory was planned with the use of a 22 gauge spinal needle. An 18 gauge trocar needle was advanced into the abscess/fluid collection and a short Amplatz super stiff wire was coiled within the collection. Appropriate positioning was confirmed with a limited CT scan. The tract was serially dilated allowing placement of a 10.2 [place] all-purpose drainage catheter. Appropriate positioning was confirmed with a limited postprocedural CT scan. 1,400 ml of thin, brown colored fluid was aspirated. The tube was connected to a drainage bag and sutured in place. A dressing was placed. The patient tolerated the procedure well without immediate post procedural complication. IMPRESSION: Successful CT guided placement of a 10.2 [place] all purpose drain catheter into the right hepatic cyst with aspiration of 1.4 L of thin, brown fluid. Samples were sent to the laboratory for culture and cytology. PLAN: Return in 2-3 weeks for drain check and consideration of sclerotherapy. Marliss Cootsylan Suttle, MD [facility] Electronically Signed   By: Marliss Cootsylan  Suttle M.D.   On: 06/18/2021 14:36   US Abdomen Limited RUQ (LIVER/GB)  Result Date: 06/15/2021 CLINICAL  DATA:  Right upper quadrant pain. Large cyst seen on chest CT EXAM: ULTRASOUND ABDOMEN LIMITED RIGHT UPPER QUADRANT COMPARISON:  CT chest 06/15/2021 FINDINGS: Gallbladder: No gallstones or wall thickening visualized. No sonographic Murphy sign noted by sonographer. Common bile duct: Diameter: 5 mm. Liver: Large complex cyst at the right hepatic lobe measuring approximately 17.2 x 12.3 x 16.3 cm containing multiple thickened, irregular internal septations, some of which demonstrate vascularity on color Doppler. Simple ovoid 4.6 cm cyst within the anterior aspect of the left hepatic lobe. Simple 1.2 cm cyst within the left hepatic lobe. Within normal limits in parenchymal echogenicity. Portal vein is patent on color Doppler imaging with normal direction of blood flow towards the liver. Other: None. IMPRESSION: 1. Large complex right hepatic lobe cyst measuring up to 17.2 cm with multiple thickened, irregular internal septations. Differential includes complex hemorrhagic or infected  hepatic cyst versus cystic hepatic neoplasm such as biliary cystadenoma or cystadenocarcinoma. Further evaluation with hepatic protocol MRI without and with IV contrast is recommended. 2. Additional smaller cysts within the liver, which appears simple. Electronically Signed   By: Duanne Guess D.O.   On: 06/15/2021 12:04   (Echo, Carotid, EGD, Colonoscopy, ERCP)    Subjective:  She is awake alert  resting in bed family by the bedside she is anxious to go home she had good bowel movements Discharge Exam: Vitals:   06/21/21 2114 06/22/21 0548  BP: 139/81 122/72  Pulse: 78 63  Resp: 17 16  Temp: 98.5 F (36.9 C) 98.1 F (36.7 C)  SpO2: 94% 95%   Vitals:   06/21/21 0509 06/21/21 1342 06/21/21 2114 06/22/21 0548  BP: 124/72 131/76 139/81 122/72  Pulse: 73 91 78 63  Resp: 16 18 17 16   Temp: 97.9 F (36.6 C) 98.6 F (37 C) 98.5 F (36.9 C) 98.1 F (36.7 C)  TempSrc: Oral  Oral Oral  SpO2: 93% 94% 94% 95%  Weight:       Height:        General: Pt is alert, awake, not in acute distress Cardiovascular: RRR, S1/S2 +, no rubs, no gallops Respiratory: CTA bilaterally, no wheezing, no rhonchi Abdominal: Soft, NT, ND, bowel sounds + Extremities: no edema, no cyanosis    The results of significant diagnostics from this hospitalization (including imaging, microbiology, ancillary and laboratory) are listed below for reference.     Microbiology: Recent Results (from the past 240 hour(s))  Resp Panel by RT-PCR (Flu A&B, Covid) Nasopharyngeal Swab     Status: None   Collection Time: 06/17/21 11:00 AM   Specimen: Nasopharyngeal Swab; Nasopharyngeal(NP) swabs in vial transport medium  Result Value Ref Range Status   SARS Coronavirus 2 by RT PCR NEGATIVE NEGATIVE Final    Comment: (NOTE) SARS-CoV-2 target nucleic acids are NOT DETECTED.  The SARS-CoV-2 RNA is generally detectable in upper respiratory specimens during the acute phase of infection. The lowest concentration of SARS-CoV-2 viral copies this assay can detect is 138 copies/mL. A negative result does not preclude SARS-Cov-2 infection and should not be used as the sole basis for treatment or other patient management decisions. A negative result may occur with  improper specimen collection/handling, submission of specimen other than nasopharyngeal swab, presence of viral mutation(s) within the areas targeted by this assay, and inadequate number of viral copies(<138 copies/mL). A negative result must be combined with clinical observations, patient history, and epidemiological information. The expected result is Negative.  Fact Sheet for Patients:  06/19/21  Fact Sheet for Healthcare Providers:  BloggerCourse.com  This test is no t yet approved or cleared by the [place] FDA and  has been authorized for detection and/or diagnosis of SARS-CoV-2 by FDA under an Emergency Use  Authorization (EUA). This EUA will remain  in effect (meaning this test can be used) for the duration of the COVID-19 declaration under Section 564(b)(1) of the Act, 21 U.S.C.section 360bbb-3(b)(1), unless the authorization is terminated  or revoked sooner.       Influenza A by PCR NEGATIVE NEGATIVE Final   Influenza B by PCR NEGATIVE NEGATIVE Final    Comment: (NOTE) The Xpert Xpress SARS-CoV-2/FLU/RSV plus assay is intended as an aid in the diagnosis of influenza from Nasopharyngeal swab specimens and should not be used as a sole basis for treatment. Nasal washings and aspirates are unacceptable for Xpert Xpress SARS-CoV-2/FLU/RSV testing.  Fact Sheet for Patients:  BloggerCourse.com  Fact Sheet for Healthcare Providers: SeriousBroker.it  This test is not yet approved or cleared by the [place] FDA and has been authorized for detection and/or diagnosis of SARS-CoV-2 by FDA under an Emergency Use Authorization (EUA). This EUA will remain in effect (meaning this test can be used) for the duration of the COVID-19 declaration under Section 564(b)(1) of the Act, 21 U.S.C. section 360bbb-3(b)(1), unless the authorization is terminated or revoked.  Performed at [facility], [address] [place], [place] [address]   Aerobic/Anaerobic Culture w Gram Stain (surgical/deep wound)     Status: None   Collection Time: 06/18/21 12:43 PM   Specimen: Liver; Abscess  Result Value Ref Range Status   Specimen Description   Final    LIVER Performed at [facility], [address], [place], [place] [address]    Special Requests   Final    NONE Performed at [facility], [address] [place], [place] [address]    Gram Stain   Final    MODERATE WBC PRESENT,BOTH PMN AND MONONUCLEAR NO ORGANISMS SEEN    Culture   Final    No growth aerobically or anaerobically. Performed at  [facility], [address], [place], [place] [address]    Report Status 06/23/2021 FINAL  Final     Labs: BNP (last 3 results) No results for input(s): BNP in the last 8760 hours. Basic Metabolic Panel: Recent Labs  Lab 06/17/21 1046 06/18/21 0420 06/20/21 1250  NA 138 138 137  K 3.4* 3.9 3.7  CL 103 104 105  CO2 25 26 23   GLUCOSE 100* 90 122*  BUN 11 9 12   CREATININE 0.70 0.79 0.50  CALCIUM 8.9 8.6* 8.9   Liver Function Tests: Recent Labs  Lab 06/17/21 1046 06/18/21 0420 06/20/21 1250  AST 20 17 15   ALT 27 22 17   ALKPHOS 79 67 74  BILITOT 0.7 0.6 0.7  PROT 7.3 6.2* 7.3  ALBUMIN 4.0 3.4* 3.7   Recent Labs  Lab 06/17/21 1046  LIPASE 23   No results for input(s): AMMONIA in the last 168 hours. CBC: Recent Labs  Lab 06/17/21 1046 06/18/21 0420 06/20/21 1250  WBC 8.1 7.4 10.9*  HGB 13.0 11.6* 13.4  HCT 38.9 34.9* 41.1  MCV 86.6 87.5 87.4  PLT 230 220 297   Cardiac Enzymes: No results for input(s): CKTOTAL, CKMB, CKMBINDEX, TROPONINI in the last 168 hours. BNP: Invalid input(s): POCBNP CBG: No results for input(s): GLUCAP in the last 168 hours. D-Dimer No results for input(s): DDIMER in the last 72 hours. Hgb A1c No results for input(s): HGBA1C in the last 72 hours. Lipid Profile No results for input(s): CHOL, HDL, LDLCALC, TRIG, CHOLHDL, LDLDIRECT in the last 72 hours. Thyroid function studies No results for input(s): TSH, T4TOTAL, T3FREE, THYROIDAB in the last 72 hours.  Invalid input(s): FREET3 Anemia work up No results for input(s): VITAMINB12, FOLATE, FERRITIN, TIBC, IRON, RETICCTPCT in the last 72 hours. Urinalysis    Component Value Date/Time   COLORURINE YELLOW 06/15/2021 1141   APPEARANCEUR CLEAR 06/15/2021 1141   LABSPEC >1.046 (H) 06/15/2021 1141   PHURINE 6.0 06/15/2021 1141   GLUCOSEU NEGATIVE 06/15/2021 1141   HGBUR MODERATE (A) 06/15/2021 1141   BILIRUBINUR NEGATIVE 06/15/2021 1141   KETONESUR NEGATIVE 06/15/2021 1141    PROTEINUR 30 (A) 06/15/2021 1141   NITRITE POSITIVE (A) 06/15/2021 1141   LEUKOCYTESUR NEGATIVE 06/15/2021 1141   Sepsis Labs Invalid input(s):  PROCALCITONIN,  WBC,  LACTICIDVEN Microbiology Recent Results (from the past 240 hour(s))  Resp Panel by RT-PCR (Flu A&B, Covid) Nasopharyngeal Swab     Status: None   Collection Time: 06/17/21 11:00 AM   Specimen: Nasopharyngeal Swab; Nasopharyngeal(NP) swabs in vial transport medium  Result Value Ref Range Status   SARS Coronavirus 2 by RT PCR NEGATIVE NEGATIVE Final    Comment: (NOTE) SARS-CoV-2 target nucleic acids are NOT DETECTED.  The SARS-CoV-2 RNA is generally detectable in upper respiratory specimens during the acute phase of infection. The lowest concentration of SARS-CoV-2 viral copies this assay can detect is 138 copies/mL. A negative result does not preclude SARS-Cov-2 infection and should not be used as the sole basis for treatment or other patient management decisions. A negative result may occur with  improper specimen collection/handling, submission of specimen other than nasopharyngeal swab, presence of viral mutation(s) within the areas targeted by this assay, and inadequate number of viral copies(<138 copies/mL). A negative result must be combined with clinical observations, patient history, and epidemiological information. The expected result is Negative.  Fact Sheet for Patients:  BloggerCourse.com  Fact Sheet for Healthcare Providers:  SeriousBroker.it  This test is no t yet approved or cleared by the [place] FDA and  has been authorized for detection and/or diagnosis of SARS-CoV-2 by FDA under an Emergency Use Authorization (EUA). This EUA will remain  in effect (meaning this test can be used) for the duration of the COVID-19 declaration under Section 564(b)(1) of the Act, 21 U.S.C.section 360bbb-3(b)(1), unless the authorization is terminated  or  revoked sooner.       Influenza A by PCR NEGATIVE NEGATIVE Final   Influenza B by PCR NEGATIVE NEGATIVE Final    Comment: (NOTE) The Xpert Xpress SARS-CoV-2/FLU/RSV plus assay is intended as an aid in the diagnosis of influenza from Nasopharyngeal swab specimens and should not be used as a sole basis for treatment. Nasal washings and aspirates are unacceptable for Xpert Xpress SARS-CoV-2/FLU/RSV testing.  Fact Sheet for Patients: BloggerCourse.com  Fact Sheet for Healthcare Providers: SeriousBroker.it  This test is not yet approved or cleared by the [place] FDA and has been authorized for detection and/or diagnosis of SARS-CoV-2 by FDA under an Emergency Use Authorization (EUA). This EUA will remain in effect (meaning this test can be used) for the duration of the COVID-19 declaration under Section 564(b)(1) of the Act, 21 U.S.C. section 360bbb-3(b)(1), unless the authorization is terminated or revoked.  Performed at [facility] [facility], [address], [place], [place] [address]   Aerobic/Anaerobic Culture w Gram Stain (surgical/deep wound)     Status: None   Collection Time: 06/18/21 12:43 PM   Specimen: Liver; Abscess  Result Value Ref Range Status   Specimen Description   Final    LIVER Performed at [facility], [address] [place], [place] [address]    Special Requests   Final    NONE Performed at [facility], [address], [place], [place] [address]    Gram Stain   Final    MODERATE WBC PRESENT,BOTH PMN AND MONONUCLEAR NO ORGANISMS SEEN    Culture   Final    No growth aerobically or anaerobically. Performed at [facility], [address], [place], [place] [address]    Report Status 06/23/2021 FINAL  Final     Time coordinating discharge: 39 minutes  SIGNED:   Alwyn Ren, MD  [facility] 06/23/2021, 4:20 PM

## 2021-07-01 MED ORDER — ALCOHOL 98 % IJ SOLN
100.0000 mL | Freq: Once | INTRAMUSCULAR | Status: DC
  Filled 2021-07-01: qty 100

## 2021-07-02 ENCOUNTER — Other Ambulatory Visit (HOSPITAL_COMMUNITY): Payer: Self-pay | Admitting: Physician Assistant

## 2021-07-02 ENCOUNTER — Ambulatory Visit (HOSPITAL_COMMUNITY)
Admission: RE | Admit: 2021-07-02 | Discharge: 2021-07-02 | Disposition: A | Discharge status: Home / Self Care | Payer: 59 | Source: Ambulatory Visit | Attending: Physician Assistant | Admitting: Physician Assistant

## 2021-07-02 ENCOUNTER — Other Ambulatory Visit: Payer: Self-pay

## 2021-07-02 ENCOUNTER — Encounter (HOSPITAL_COMMUNITY): Payer: Self-pay

## 2021-07-02 DIAGNOSIS — K7689 Other specified diseases of liver: Secondary | ICD-10-CM

## 2021-07-02 DIAGNOSIS — I10 Essential (primary) hypertension: Secondary | ICD-10-CM | POA: Insufficient documentation

## 2021-07-02 HISTORY — PX: IR SCLEROTHERAPY OF A FLUID COLLECTION: IMG6090

## 2021-07-02 HISTORY — PX: IR REMOVAL BILIARY DRAIN: IMG6047

## 2021-07-02 HISTORY — PX: IR 3D INDEPENDENT WKST: IMG2385

## 2021-07-02 HISTORY — DX: Other complications of anesthesia, initial encounter: T88.59XA

## 2021-07-02 MED ORDER — MIDAZOLAM HCL 2 MG/2ML IJ SOLN
INTRAMUSCULAR | Status: AC | PRN
  Administered 2021-07-02: 1 mg via INTRAVENOUS

## 2021-07-02 MED ORDER — IOHEXOL 300 MG/ML  SOLN
50.0000 mL | Freq: Once | INTRAMUSCULAR | Status: AC | PRN
  Administered 2021-07-02: 20 mL

## 2021-07-02 MED ORDER — FENTANYL CITRATE (PF) 100 MCG/2ML IJ SOLN
INTRAMUSCULAR | Status: AC
  Filled 2021-07-02: qty 6

## 2021-07-02 MED ORDER — MIDAZOLAM HCL 2 MG/2ML IJ SOLN
INTRAMUSCULAR | Status: AC
  Filled 2021-07-02: qty 6

## 2021-07-02 MED ORDER — SODIUM CHLORIDE 0.9 % IV SOLN: INTRAVENOUS | Status: DC

## 2021-07-02 MED ORDER — MIDAZOLAM HCL 2 MG/2ML IJ SOLN
INTRAMUSCULAR | Status: AC | PRN
  Administered 2021-07-02: 2 mg via INTRAVENOUS

## 2021-07-02 MED ORDER — FENTANYL CITRATE (PF) 100 MCG/2ML IJ SOLN
INTRAMUSCULAR | Status: AC | PRN
  Administered 2021-07-02: 50 ug via INTRAVENOUS

## 2021-07-02 MED ORDER — MIDAZOLAM HCL 2 MG/2ML IJ SOLN
INTRAMUSCULAR | Status: AC | PRN
Start: 2021-07-02 — End: 2021-07-02
  Administered 2021-07-02: 1 mg via INTRAVENOUS

## 2021-07-02 MED ORDER — ALCOHOL 98 % IJ SOLN
INTRAMUSCULAR | Status: AC | PRN
  Administered 2021-07-02: 20 mL

## 2021-07-02 NOTE — H&P (Signed)
Chief Complaint: Patient was seen in consultation today for hepatic cyst sclerotherapy.  Referring Physician(s): Sheliah PlaneBoisseau,Hayley  Supervising Physician: Megan CootsSuttle, Dylan  [facility]  History of Present Illness: Megan Wilkins is a 51 y.o. 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 Ms. Megan Wilkins [facility]21 with worsening RUQ pain, she was found to have a large complex right hepatic lobe cyst measuring up to 17.2 cm with multiple thickened, irregular septations. A hepatic protocol MRI was recommended for follow up. She was discharged that same day with pain medications and instructions to follow up with her PCP to discuss further imaging. She returned to the ED on 12/28 due to worsening pain and underwent MRI which showed a 13.2 cm complex cystic lesion in the right hepatic lobe. IR was consulted and she underwent percutaneous drain placement on 12/29 with Dr. Elby ShowersSuttle. She returns today for drain injection and sclerotherapy.   Ms. Megan Wilkins has some RUQ discomfort where the drain is but otherwise has been feeling well overall. Her daughter flushes the drain once per day and they have not had any issues with this. The output varies between 20-70 cc per day and is typically light brown with some occasional small blood streaks noted.   Past Medical History:  Diagnosis Date   Complication of anesthesia    allergic to local "caines" family   Hypertension     Past Surgical History:  Procedure Laterality Date   KNEE ARTHROSCOPY     Meniscus    Allergies: Aspercreme [trolamine salicylate], Lidocaine, and Novocain [procaine]  Medications: Prior to Admission medications   Medication Sig Start Date End Date Taking? Authorizing Provider  acetaminophen (TYLENOL) 500 MG tablet Take 1,000 mg by mouth every 6 (six) hours as needed for mild pain.    Yes [provider]  Azelastine-Fluticasone 137-50 MCG/ACT SUSP Place 1 spray into both nostrils 2 (two) times daily. 01/07/21  Yes [provider]  cyclobenzaprine (FLEXERIL) 5 MG tablet Take 1 tablet (5 mg total) by mouth 3 (three) times daily as needed for muscle spasms. 06/22/21  Yes Alwyn RenMathews, Elizabeth G, MD  escitalopram (LEXAPRO) 20 MG tablet Take 20 mg by mouth every evening. 08/20/20  Yes [provider]  levocetirizine (XYZAL) 5 MG tablet Take 5 mg by mouth every evening.   Yes [provider]  montelukast (SINGULAIR) 10 MG tablet Take 10 mg by mouth every evening. 05/01/21  Yes [provider]  Multiple Vitamin (MULTIVITAMIN) tablet Take 1 tablet by mouth daily.   Yes [provider]  ondansetron (ZOFRAN) 4 MG tablet Take 1 tablet (4 mg total) by mouth every 6 (six) hours as needed for nausea. 06/22/21  Yes Alwyn RenMathews, Elizabeth G, MD  PROAIR RESPICLICK 108 [phone](90 Base) MCG/ACT AEPB Inhale 1 puff into the lungs every 6 (six) hours as needed for wheezing or shortness of breath. 01/07/21  Yes [provider]  bisacodyl (DULCOLAX) 5 MG EC tablet Take 2 tablets (10 mg total) by mouth daily. 06/23/21   Alwyn RenMathews, Elizabeth G, MD  Ferrous Sulfate (SLOW FE PO) Take 1 tablet by mouth daily.    [provider]  polyethylene glycol (MIRALAX / GLYCOLAX) 17 g packet Take 17 g by mouth daily. 06/23/21   Alwyn RenMathews, Elizabeth G, MD  ZAFEMY 150-35 MCG/24HR transdermal patch Place 1 patch onto the skin once a week. 06/04/21   [provider]     No family history on file.  Social History   Socioeconomic History   Marital status: Married    Spouse name: Not on file   Number of children: Not on file   Years of education: Not on file   Highest education level: Not on file  Occupational History   Not on file  Tobacco Use   Smoking status: Never   Smokeless tobacco: Never  Vaping Use   Vaping Use: Never used  Substance and Sexual  Activity   Alcohol use: Not on file   Drug use: Never   Sexual activity: Not on file  Other Topics Concern   Not on file  Social History Narrative   ** Merged History Encounter **       Social Determinants of Health   Financial Resource Strain: Not on file  Food Insecurity: Not on file  Transportation Needs: Not on file  Physical Activity: Not on file  Stress: Not on file  Social Connections: Not on file     Review of Systems: A 12 point ROS discussed and pertinent positives are indicated in the HPI above.  All other systems are negative.  Review of Systems  Constitutional:  Negative for chills and fever.  Respiratory:  Negative for cough and shortness of breath.   Cardiovascular:  Negative for chest pain.  Gastrointestinal:  Positive for abdominal pain (RUQ where drain is). Negative for nausea and vomiting.  Musculoskeletal:  Negative for back pain.  Neurological:  Negative for dizziness and headaches.   Vital Signs: LMP 06/23/2021   Physical Exam Vitals reviewed.  Constitutional:      General: She is not in acute distress. HENT:     Head: Normocephalic.     Mouth/Throat:     Mouth: Mucous membranes are moist.     Pharynx: Oropharynx is clear. No oropharyngeal exudate or posterior oropharyngeal erythema.  Cardiovascular:     Rate and Rhythm: Normal rate and regular rhythm.  Pulmonary:     Effort: Pulmonary effort is normal.     Breath sounds: Normal breath sounds.  Abdominal:     General: There is no distension.     Palpations: Abdomen is soft.     Tenderness: There is no abdominal tenderness.     Comments: (+) RUQ drain to gravity bag with ~15 cc thin brown output. Insertion site unremarkable.  Skin:    General: Skin is warm and dry.  Neurological:     Mental Status: She is alert and oriented to person, place, and time.  Psychiatric:        Mood and Affect: Mood normal.        Behavior: Behavior normal.        Thought Content: Thought content normal.         Judgment: Judgment normal.     MD Evaluation Airway: WNL Heart: WNL Abdomen: WNL Chest/ Lungs: WNL ASA  Classification: 2 Mallampati/Airway Score: One   Imaging: CT Angio Chest PE W and/or Wo Contrast  Result Date: 06/15/2021 CLINICAL DATA:  Pulmonary embolism suspected. Positive D-dimer. Right-sided pain. EXAM: CT ANGIOGRAPHY CHEST WITH CONTRAST TECHNIQUE: Multidetector CT imaging of the chest was performed using the standard protocol during bolus administration of intravenous contrast. Multiplanar CT image reconstructions and MIPs were obtained to evaluate the vascular anatomy. CONTRAST:  75mL OMNIPAQUE IOHEXOL 350 MG/ML SOLN COMPARISON:  Chest radiography same day FINDINGS: Cardiovascular: Borderline cardiomegaly. Small amount of pericardial fluid in the superior recess. No coronary artery  calcification. Mild ectasia of the aorta. Maximal diameter of the ascending aorta is 3.4 cm. Pulmonary arterial opacification is good. There are no pulmonary emboli. Mediastinum/Nodes: No mediastinal or hilar mass or adenopathy. Lungs/Pleura: Small right pleural effusion layering dependently. Patchy infiltrate in both lower lobes, more extensive on the right than the left, consistent with bronchopneumonia. No lobar consolidation or collapse. Upper lobes are clear. Upper Abdomen: Massive cyst within the right lobe of the liver measuring up to 14 cm in diameter. Adjacent cyst at the inferior margin of the larger cyst measuring up to 5 cm in diameter. Several other scattered small cysts within the liver. Musculoskeletal: Normal Review of the MIP images confirms the above findings. IMPRESSION: No pulmonary emboli. Small pleural effusion on the right. Patchy infiltrate and volume loss in both lower lobes consistent with bronchopneumonia. No dense consolidation or lobar collapse. Mild ectasia of the aorta but without evidence of frank aneurysm or atherosclerotic calcification. Maximal diameter of the ascending  aorta is 3.4 cm. No follow-up imaging recommended at this point. Multiple liver cysts, including a massive cyst in the right lobe measuring up to 14 cm in diameter. Electronically Signed   By: Paulina Fusi M.D.   On: 06/15/2021 10:02   MR Abdomen W or Wo Contrast  Result Date: 06/17/2021 CLINICAL DATA:  Complex cystic liver lesion on recent ultrasound. EXAM: MRI ABDOMEN WITHOUT AND WITH CONTRAST TECHNIQUE: Multiplanar multisequence MR imaging of the abdomen was performed both before and after the administration of intravenous contrast. CONTRAST:  70mL GADAVIST GADOBUTROL 1 MMOL/ML IV SOLN COMPARISON:  None. FINDINGS: Lower chest: Small right and tiny left pleural effusions are seen. Bibasilar atelectasis is noted. Hepatobiliary: Several simple cysts are seen in both the right and left hepatic lobes, largest in the anterior right lobe measuring 5 cm. A large complex cystic lesion is seen involving the anterior and posterior segments of the right lobe, which measures 13.2 x 12.9 cm. This lesion shows T1 hyperintense fluid and multiple thin irregular internal septations, some showing mild contrast enhancement on subtraction imaging. No solid nodular components identified. No evidence of edema in adjacent hepatic parenchyma. No solid hepatic masses are identified. Gallbladder is unremarkable. No evidence of biliary ductal dilatation. Pancreas:  No mass or inflammatory changes. Spleen:  Within normal limits in size and appearance. Adrenals/Urinary Tract: No masses identified. No evidence of hydronephrosis. Stomach/Bowel: Visualized portion unremarkable. Vascular/Lymphatic: No pathologically enlarged lymph nodes identified. No acute vascular findings. Other:  None. Musculoskeletal:  No suspicious bone lesions identified. IMPRESSION: 13.2 cm complex cystic lesion in right hepatic lobe, with nonspecific but probably benign characteristics. Abscess is considered unlikely, however biliary cystadenoma cannot be excluded.  Small bilateral pleural effusions and bibasilar atelectasis. Electronically Signed   By: Megan Wilkins M.D.   On: 06/17/2021 13:43   DG Chest Port 1 View  Result Date: 06/15/2021 CLINICAL DATA:  Right rib pain with no injury. EXAM: PORTABLE CHEST 1 VIEW COMPARISON:  None. FINDINGS: Numerous leads and wires project over the chest. Midline trachea. Normal heart size for level of inspiration. Mild to moderate right hemidiaphragm elevation. No pleural effusion or pneumothorax. Left base airspace disease. Minimal medial right lung base volume loss and atelectasis. IMPRESSION: Left base airspace disease, suspicious for pneumonia. Correlate with infectious symptoms, given clinical history of right rib pain. Followup PA and lateral chest X-ray is recommended in 3-4 weeks following trial of antibiotic therapy to ensure resolution and exclude underlying malignancy. In addition, comparison with prior radiographs would be informative  if available. Electronically Signed   By: Jeronimo Greaves M.D.   On: 06/15/2021 08:45   CT IMAGE GUIDED DRAINAGE BY PERCUTANEOUS CATHETER  Result Date: 06/18/2021 INDICATION: 51 year old female with history of symptomatic hepatic cyst. EXAM: CT IMAGE GUIDED DRAINAGE BY PERCUTANEOUS CATHETER COMPARISON:  06/17/2021 MEDICATIONS: The patient is currently admitted to the hospital and receiving intravenous antibiotics. The antibiotics were administered within an appropriate time frame prior to the initiation of the procedure. ANESTHESIA/SEDATION: Moderate (conscious) sedation was employed during this procedure. A total of Versed 4 mg and Fentanyl 200 mcg was administered intravenously. Moderate Sedation Time: 26 minutes. The patient's level of consciousness and vital signs were monitored continuously by radiology nursing throughout the procedure under my direct supervision. CONTRAST:  None COMPLICATIONS: None immediate. PROCEDURE: Informed written consent was obtained from the patient after a  discussion of the risks, benefits and alternatives to treatment. The patient was placed supine on the CT gantry and a pre procedural CT was performed re-demonstrating the known abscess/fluid collection within the right lobe of the liver. The procedure was planned. A timeout was performed prior to the initiation of the procedure. The right upper quadrant was prepped and draped in the usual sterile fashion. The overlying soft tissues were anesthetized with normal saline. Appropriate trajectory was planned with the use of a 22 gauge spinal needle. An 18 gauge trocar needle was advanced into the abscess/fluid collection and a short Amplatz super stiff wire was coiled within the collection. Appropriate positioning was confirmed with a limited CT scan. The tract was serially dilated allowing placement of a 10.2 [place] all-purpose drainage catheter. Appropriate positioning was confirmed with a limited postprocedural CT scan. 1,400 ml of thin, brown colored fluid was aspirated. The tube was connected to a drainage bag and sutured in place. A dressing was placed. The patient tolerated the procedure well without immediate post procedural complication. IMPRESSION: Successful CT guided placement of a 10.2 [place] all purpose drain catheter into the right hepatic cyst with aspiration of 1.4 L of thin, brown fluid. Samples were sent to the laboratory for culture and cytology. PLAN: Return in 2-3 weeks for drain check and consideration of sclerotherapy. Megan Coots, MD [facility] Electronically Signed   By: Megan Wilkins M.D.   On: 06/18/2021 14:36   US Abdomen Limited RUQ (LIVER/GB)  Result Date: 06/15/2021 CLINICAL DATA:  Right upper quadrant pain. Large cyst seen on chest CT EXAM: ULTRASOUND ABDOMEN LIMITED RIGHT UPPER QUADRANT COMPARISON:  CT chest 06/15/2021 FINDINGS: Gallbladder: No gallstones or wall thickening visualized. No sonographic Murphy sign noted by  sonographer. Common bile duct: Diameter: 5 mm. Liver: Large complex cyst at the right hepatic lobe measuring approximately 17.2 x 12.3 x 16.3 cm containing multiple thickened, irregular internal septations, some of which demonstrate vascularity on color Doppler. Simple ovoid 4.6 cm cyst within the anterior aspect of the left hepatic lobe. Simple 1.2 cm cyst within the left hepatic lobe. Within normal limits in parenchymal echogenicity. Portal vein is patent on color Doppler imaging with normal direction of blood flow towards the liver. Other: None. IMPRESSION: 1. Large complex right hepatic lobe cyst measuring up to 17.2 cm with multiple thickened, irregular internal septations. Differential includes complex hemorrhagic or infected hepatic cyst versus cystic hepatic neoplasm such as biliary cystadenoma or cystadenocarcinoma. Further evaluation with hepatic protocol MRI without and with IV contrast is recommended. 2. Additional smaller cysts within the liver, which appears simple. Electronically Signed   By:  Megan  Wilkins D.O.   On: 06/15/2021 12:04    Labs:  CBC: Recent Labs    06/15/21 0841 06/17/21 1046 06/18/21 0420 06/20/21 1250  WBC 13.5* 8.1 7.4 10.9*  HGB 13.4 13.0 11.6* 13.4  HCT 39.7 38.9 34.9* 41.1  PLT 222 230 220 297    COAGS: No results for input(s): INR, APTT in the last 8760 hours.  BMP: Recent Labs    06/15/21 0841 06/17/21 1046 06/18/21 0420 06/20/21 1250  NA 136 138 138 137  K 3.5 3.4* 3.9 3.7  CL 103 103 104 105  CO2 23 25 26 23   GLUCOSE 148* 100* 90 122*  BUN 10 11 9 12   CALCIUM 8.9 8.9 8.6* 8.9  CREATININE 0.53 0.70 0.79 0.50  GFRNONAA >60 >60 >60 >60    LIVER FUNCTION TESTS: Recent Labs    06/15/21 0841 06/17/21 1046 06/18/21 0420 06/20/21 1250  BILITOT 0.6 0.7 0.6 0.7  AST 21 20 17 15   ALT 33 27 22 17   ALKPHOS 94 79 67 74  PROT 7.0 7.3 6.2* 7.3  ALBUMIN 4.2 4.0 3.4* 3.7    TUMOR MARKERS: No results for input(s): AFPTM, CEA, CA199,  CHROMGRNA in the last 8760 hours.  Assessment and Plan:  51 y/o F with history of right hepatic cyst s/p percutaneous aspiration and drain placement yielding 1.4 L of fluid 06/18/21 in IR who presents today for drain injection/sclerotherapy.   She will follow up with Dr. in clinic in 2-4 weeks.  Risks and benefits discussed with the patient including bleeding, infection, damage to adjacent structures, fistula connection, and sepsis.  All of the patient's questions were answered, patient is agreeable to proceed.  Consent signed and in chart.  Thank you for this interesting consult.  I greatly enjoyed meeting Vivian Neuwirth and look forward to participating in their care.  A copy of this report was sent to the requesting provider on this date.  Electronically Signed: 44, PA-C 07/02/2021, 10:22 AM   I spent a total of 30 Minutes   in face to face in clinical consultation, greater than 50% of which was counseling/coordinating care for hepatic cyst drain injection/sclerotherapy.

## 2021-07-02 NOTE — Procedures (Signed)
Interventional Radiology Procedure Note  Procedure: Hepatic cyst sclerotherapy  Findings: Please refer to procedural dictation for full description. 20 mL dehydrated alcohol injected with 20 min dwell time.  Cyst irrigated with 200 mL saline, drain removed.   Complications: None immediate  Estimated Blood Loss: < 5 mL  Recommendations: Discharge home in 1 hour if stable. Follow up in IR clinic in 2-4 weeks with non-contrast CT abdomen.   Marliss Coots, MD Pager: [phone]

## 2021-07-03 ENCOUNTER — Other Ambulatory Visit: Payer: Self-pay | Admitting: Surgery

## 2021-07-03 DIAGNOSIS — K7689 Other specified diseases of liver: Secondary | ICD-10-CM

## 2021-07-14 ENCOUNTER — Other Ambulatory Visit: Payer: Self-pay | Admitting: Home Modifications

## 2021-07-14 DIAGNOSIS — R7989 Other specified abnormal findings of blood chemistry: Secondary | ICD-10-CM

## 2021-07-14 DIAGNOSIS — R0602 Shortness of breath: Secondary | ICD-10-CM

## 2021-07-15 ENCOUNTER — Encounter: Payer: Self-pay | Admitting: *Deleted

## 2021-07-15 ENCOUNTER — Ambulatory Visit
Admission: RE | Admit: 2021-07-15 | Discharge: 2021-07-15 | Disposition: A | Discharge status: Home / Self Care | Payer: 59 | Source: Ambulatory Visit | Attending: Physician Assistant | Admitting: Physician Assistant

## 2021-07-15 ENCOUNTER — Ambulatory Visit
Admission: RE | Admit: 2021-07-15 | Discharge: 2021-07-15 | Disposition: A | Discharge status: Home / Self Care | Payer: 59 | Source: Ambulatory Visit | Attending: Surgery | Admitting: Surgery

## 2021-07-15 ENCOUNTER — Ambulatory Visit
Admission: RE | Admit: 2021-07-15 | Discharge: 2021-07-15 | Disposition: A | Discharge status: Home / Self Care | Payer: 59 | Source: Ambulatory Visit | Attending: Home Modifications | Admitting: Home Modifications

## 2021-07-15 DIAGNOSIS — R0602 Shortness of breath: Secondary | ICD-10-CM

## 2021-07-15 DIAGNOSIS — K7689 Other specified diseases of liver: Secondary | ICD-10-CM

## 2021-07-15 DIAGNOSIS — R7989 Other specified abnormal findings of blood chemistry: Secondary | ICD-10-CM

## 2021-07-15 HISTORY — PX: IR RADIOLOGIST EVAL & MGMT: IMG5224

## 2021-07-15 MED ORDER — IOPAMIDOL (ISOVUE-370) INJECTION 76%
75.0000 mL | Freq: Once | INTRAVENOUS | Status: AC | PRN
  Administered 2021-07-15: 75 mL via INTRAVENOUS

## 2021-07-15 NOTE — Progress Notes (Signed)
Reason for follow up:   Status post hepatic cyst drainage and sclerotherapy  History of Present Illness: 51 year old female with history of symptomatic simple hepatic cyst which was drained (1.4 L) on 06/18/21 and sclerosed with dehydrated ethanol on 07/02/21.  She presents today for short term follow up with abdominal CT scan.  She endorses worsening RUQ discomfort and intermittent right shoulder pain.  She has been very fatigued, severely limiting her daily activities.  She endorses some shortness of breath, for which her PCP has ordered a CTPA which was also done today.  No fevers or chills, nausea or vomiting.  Past Medical History:  Diagnosis Date   Complication of anesthesia    allergic to local "caines" family   Hypertension     Past Surgical History:  Procedure Laterality Date   IR 3D INDEPENDENT WKST  07/02/2021   IR REMOVAL BILIARY DRAIN  07/02/2021   IR SCLEROTHERAPY OF A FLUID COLLECTION  07/02/2021   KNEE ARTHROSCOPY     Meniscus    Allergies: Aspercreme [trolamine salicylate], Lidocaine, and Novocain [procaine]  Medications: Prior to Admission medications   Medication Sig Start Date End Date Taking? Authorizing Provider  acetaminophen (TYLENOL) 500 MG tablet Take 1,000 mg by mouth every 6 (six) hours as needed for mild pain.    [provider]  Azelastine-Fluticasone 137-50 MCG/ACT SUSP Place 1 spray into both nostrils 2 (two) times daily. 01/07/21   [provider]  bisacodyl (DULCOLAX) 5 MG EC tablet Take 2 tablets (10 mg total) by mouth daily. 06/23/21   Georgette Shell, MD  cyclobenzaprine (FLEXERIL) 5 MG tablet Take 1 tablet (5 mg total) by mouth 3 (three) times daily as needed for muscle spasms. 06/22/21   Georgette Shell, MD  doxycycline (DORYX) 100 MG EC tablet Take 100 mg by mouth 2 (two) times daily. x7days 07/01/21   [provider]  escitalopram (LEXAPRO) 20 MG tablet Take 20 mg by mouth every evening. 08/20/20   [provider]  Ferrous Sulfate (SLOW FE PO) Take 1 tablet by mouth daily.    [provider]  levocetirizine (XYZAL) 5 MG tablet Take 5 mg by mouth every evening.    [provider]  montelukast (SINGULAIR) 10 MG tablet Take 10 mg by mouth every evening. 05/01/21   [provider]  Multiple Vitamin (MULTIVITAMIN) tablet Take 1 tablet by mouth daily.    [provider]  ondansetron (ZOFRAN) 4 MG tablet Take 1 tablet (4 mg total) by mouth every 6 (six) hours as needed for nausea. 06/22/21   Georgette Shell, MD  polyethylene glycol (MIRALAX / GLYCOLAX) 17 g packet Take 17 g by mouth daily. 06/23/21   Georgette Shell, MD  PROAIR RESPICLICK [id number] [phone] Base) MCG/ACT AEPB Inhale 1 puff into the lungs every 6 (six) hours as needed for wheezing or shortness of breath. 01/07/21   [provider]  ZAFEMY 150-35 MCG/24HR transdermal patch Place 1 patch onto the skin once a week. 06/04/21   [provider]     No family history on file.  Social History   Socioeconomic History   Marital status: Married    Spouse name: Not on file   Number of children: Not on file   Years of education: Not on file   Highest education level: Not on file  Occupational History   Not on file  Tobacco Use   Smoking status: Never   Smokeless tobacco: Never  Vaping  Use   Vaping Use: Never used  Substance and Sexual Activity   Alcohol use: Not on file   Drug use: Never   Sexual activity: Not on file  Other Topics Concern   Not on file  Social History Narrative   ** Merged History Encounter **       Social Determinants of Health   Financial Resource Strain: Not on file  Food Insecurity: Not on file  Transportation Needs: Not on file  Physical Activity: Not on file  Stress: Not on file  Social Connections: Not on file     Vital Signs: LMP 06/23/2021   Physical Exam Constitutional:      General: She is not in acute distress. HENT:     Head:  Normocephalic.     Mouth/Throat:     Mouth: Mucous membranes are moist.  Eyes:     General: No scleral icterus. Cardiovascular:     Rate and Rhythm: Normal rate and regular rhythm.  Pulmonary:     Breath sounds: Normal breath sounds.  Abdominal:     General: There is no distension.  Musculoskeletal:        General: No swelling.  Skin:    General: Skin is warm and dry.     Coloration: Skin is not jaundiced.  Neurological:     Mental Status: She is alert and oriented to person, place, and time.    Imaging: CT ABDOMEN WO CONTRAST  Result Date: 07/15/2021 CLINICAL DATA:  51 year old female with history of symptomatic right hepatic cyst status post percutaneous drainage and ethanol sclerotherapy on 07/02/2021. EXAM: CT ABDOMEN WITHOUT CONTRAST TECHNIQUE: Multidetector CT imaging of the abdomen was performed following the standard protocol without IV contrast. RADIATION DOSE REDUCTION: This exam was performed according to the departmental dose-optimization program which includes automated exposure control, adjustment of the mA and/or kV according to patient size and/or use of iterative reconstruction technique. COMPARISON:  MRI abdomen from 06/17/2021, CT-guided drain placement from 06/18/2021, sclerotherapy from 07/02/2021 FINDINGS: Lower chest: Trace residual right pleural effusion. Lungs are otherwise clear. The heart is normal in size. No pericardial effusion. Hepatobiliary: The liver is normal in size, contour, and attenuation. Residual right hepatic cyst is present and measures approximately 6.9 x 7.9 x 5.5 cm (AP by trans by cc), initially measuring up to 17 cm. Similar appearance of few additional scattered simple hepatic cysts, the largest just inferior to the sclerosed CIS which measures up to 4.4 cm in greatest axial dimension. No focal mass is visualized. The gallbladder is present unremarkable. No intra or extrahepatic biliary ductal dilation. Pancreas: Unremarkable. No pancreatic  ductal dilatation or surrounding inflammatory changes. Spleen: Normal in size without focal abnormality. Adrenals/Urinary Tract: Adrenal glands are unremarkable. Kidneys are normal, without renal calculi, focal lesion, or hydronephrosis. Stomach/Bowel: Stomach is within normal limits. No evidence of bowel wall thickening, distention, or inflammatory changes. Vascular/Lymphatic: No significant vascular findings are present. No enlarged abdominal lymph nodes. Other: No abdominal wall hernia or abnormality. Musculoskeletal: No acute or significant osseous findings. IMPRESSION: 1. Approximally 90% volume reduction of previously drained and sclerosed right hepatic cyst. No complicating features. 2. Trace residual right pleural effusion. Ruthann Cancer, MD [facility] Electronically Signed   By: Ruthann Cancer M.D.   On: 07/15/2021 14:34   CT Angio Chest Pulmonary Embolism (PE) W or WO Contrast  Result Date: 07/15/2021 CLINICAL DATA:  Elevated D-dimer, shortness of breath EXAM: CT ANGIOGRAPHY CHEST WITH CONTRAST TECHNIQUE: Multidetector CT imaging  of the chest was performed using the standard protocol during bolus administration of intravenous contrast. Multiplanar CT image reconstructions and MIPs were obtained to evaluate the vascular anatomy. RADIATION DOSE REDUCTION: This exam was performed according to the departmental dose-optimization program which includes automated exposure control, adjustment of the mA and/or kV according to patient size and/or use of iterative reconstruction technique. CONTRAST:  6mL ISOVUE-370 IOPAMIDOL (ISOVUE-370) INJECTION 76% COMPARISON:  06/15/2021 FINDINGS: Cardiovascular: There is homogeneous enhancement in the thoracic aorta. There is ectasia of ascending thoracic aorta measuring 3.8 cm. There are no intraluminal filling defects in the pulmonary artery branches. Mediastinum/Nodes: There are slightly enlarged lymph nodes in  the mediastinum and both hilar regions. Lungs/Pleura: Small right pleural effusion is seen. There is no focal pulmonary consolidation. In the image 84 of series 14 there is 4 mm nodule in the subpleural location in the left lower lobe. There is crowding of markings in the posterior right lower lung fields. There is interval improvement in aeration and clearing of patchy infiltrates in both lower lung fields since 06/15/2021. there is no pneumothorax. Upper Abdomen: There is interval decrease in large cyst in the right lobe. There is linear area of soft tissue attenuation in the lesion. Findings may suggest interval intervention. There are other smaller low-density lesions in the liver which are not optimally evaluated in this study only during arterial phase. There is fatty infiltration in the liver. Spleen measures 12.9 cm. Musculoskeletal: Unremarkable. Review of the MIP images confirms the above findings. IMPRESSION: There is no evidence of pulmonary artery embolism. There is no evidence of thoracic aortic dissection. Small right pleural effusion is seen. There is no focal pulmonary consolidation. There are multiple low-attenuation lesions of varying sizes in the liver largest measuring 9.8 cm in diameter. There is interval decrease in size of this cystic lesion in the right lobe since 06/15/2021. Please correlate for history of interval intervention and consider follow-up multiphasic CT or MRI. Electronically Signed   By: Elmer Picker M.D.   On: 07/15/2021 13:58    Labs:  CBC: Recent Labs    06/15/21 0841 06/17/21 1046 06/18/21 0420 06/20/21 1250  WBC 13.5* 8.1 7.4 10.9*  HGB 13.4 13.0 11.6* 13.4  HCT 39.7 38.9 34.9* 41.1  PLT 222 230 220 297    COAGS: No results for input(s): INR, APTT in the last 8760 hours.  BMP: Recent Labs    06/15/21 0841 06/17/21 1046 06/18/21 0420 06/20/21 1250  NA 136 138 138 137  K 3.5 3.4* 3.9 3.7  CL 103 103 104 105  CO2 23 25 26 23   GLUCOSE 148*  100* 90 122*  BUN 10 11 9 12   CALCIUM 8.9 8.9 8.6* 8.9  CREATININE 0.53 0.70 0.79 0.50  GFRNONAA >60 >60 >60 >60    LIVER FUNCTION TESTS: Recent Labs    06/15/21 0841 06/17/21 1046 06/18/21 0420 06/20/21 1250  BILITOT 0.6 0.7 0.6 0.7  AST 21 20 17 15   ALT 33 27 22 17   ALKPHOS 94 79 67 74  PROT 7.0 7.3 6.2* 7.3  ALBUMIN 4.2 4.0 3.4* 3.7    Assessment and Plan: 51 year old female with history of symptomatic simple hepatic cyst status post aspiration/drainage and ethanol sclerotherapy (07/02/21).  She has experienced increasing fatigue since the sclerotherapy, of indeterminate etiology, and some right shoulder pain, likely referred from diaphragmatic irritation.  She has been following with Dr. Zenia Resides as well.    Follow up with 1 month with repeat CT abdomen  with contrast to assess any potential reaccumulation of cyst.   Electronically Signed: Rosanne Ashing Malkie Wille 07/15/2021, 3:16 PM   I spent a total of 40 Minutes in face to face in clinical consultation, greater than 50% of which was counseling/coordinating care for hepatic cyst.

## 2021-07-27 ENCOUNTER — Other Ambulatory Visit: Payer: Self-pay | Admitting: Surgery

## 2021-07-27 DIAGNOSIS — K7689 Other specified diseases of liver: Secondary | ICD-10-CM

## 2021-08-04 ENCOUNTER — Ambulatory Visit
Admission: RE | Admit: 2021-08-04 | Discharge: 2021-08-04 | Disposition: A | Discharge status: Home / Self Care | Payer: 59 | Source: Ambulatory Visit | Attending: Surgery | Admitting: Surgery

## 2021-08-04 ENCOUNTER — Other Ambulatory Visit: Payer: Self-pay | Admitting: Surgery

## 2021-08-04 ENCOUNTER — Encounter: Payer: Self-pay | Admitting: *Deleted

## 2021-08-04 DIAGNOSIS — K7689 Other specified diseases of liver: Secondary | ICD-10-CM

## 2021-08-04 HISTORY — PX: IR RADIOLOGIST EVAL & MGMT: IMG5224

## 2021-08-04 MED ORDER — IOPAMIDOL (ISOVUE-300) INJECTION 61%
100.0000 mL | Freq: Once | INTRAVENOUS | Status: AC | PRN
  Administered 2021-08-04: 100 mL via INTRAVENOUS

## 2021-08-04 NOTE — Progress Notes (Signed)
Referring Physician(s): Allen,Shelby L  Reason for follow up:   RUQ pain, status post hepatic cyst drainage and sclerotherapy   History of Present Illness: 51 year old female with history of symptomatic simple hepatic cyst which was drained (1.4 L) on 06/18/21 and sclerosed with dehydrated ethanol on 07/02/21.  She presents today for short term follow up with abdominal CT scan due to persistent pain.   She endorses persistent RUQ discomfort and intermittent right shoulder pain. The pain is exacerbated with sneezing.  She has tried occasional Tylenol without any relief.  Her prior fatigue which was limiting activities of daily living has improved significantly.  No fevers or chills, nausea or vomiting.  Past Medical History:  Diagnosis Date   Complication of anesthesia    allergic to local "caines" family   Hypertension     Past Surgical History:  Procedure Laterality Date   IR 3D INDEPENDENT WKST  07/02/2021   IR RADIOLOGIST EVAL & MGMT  07/15/2021   IR REMOVAL BILIARY DRAIN  07/02/2021   IR SCLEROTHERAPY OF A FLUID COLLECTION  07/02/2021   KNEE ARTHROSCOPY     Meniscus    Allergies: Aspercreme [trolamine salicylate], Lidocaine, and Novocain [procaine]  Medications: Prior to Admission medications   Medication Sig Start Date End Date Taking? Authorizing Provider  acetaminophen (TYLENOL) 500 MG tablet Take 1,000 mg by mouth every 6 (six) hours as needed for mild pain.    [provider]  Azelastine-Fluticasone 137-50 MCG/ACT SUSP Place 1 spray into both nostrils 2 (two) times daily. 01/07/21   [provider]  bisacodyl (DULCOLAX) 5 MG EC tablet Take 2 tablets (10 mg total) by mouth daily. 06/23/21   Alwyn Ren, MD  cyclobenzaprine (FLEXERIL) 5 MG tablet Take 1 tablet (5 mg total) by mouth 3 (three) times daily as needed for muscle spasms. 06/22/21   Alwyn Ren, MD  doxycycline (DORYX) 100 MG EC tablet Take 100 mg by mouth 2 (two) times daily.  x7days 07/01/21   [provider]  escitalopram (LEXAPRO) 20 MG tablet Take 20 mg by mouth every evening. 08/20/20   [provider]  Ferrous Sulfate (SLOW FE PO) Take 1 tablet by mouth daily.    [provider]  levocetirizine (XYZAL) 5 MG tablet Take 5 mg by mouth every evening.    [provider]  montelukast (SINGULAIR) 10 MG tablet Take 10 mg by mouth every evening. 05/01/21   [provider]  Multiple Vitamin (MULTIVITAMIN) tablet Take 1 tablet by mouth daily.    [provider]  ondansetron (ZOFRAN) 4 MG tablet Take 1 tablet (4 mg total) by mouth every 6 (six) hours as needed for nausea. 06/22/21   Alwyn Ren, MD  polyethylene glycol (MIRALAX / GLYCOLAX) 17 g packet Take 17 g by mouth daily. 06/23/21   Alwyn Ren, MD  PROAIR RESPICLICK [phone] Base) MCG/ACT AEPB Inhale 1 puff into the lungs every 6 (six) hours as needed for wheezing or shortness of breath. 01/07/21   [provider]  ZAFEMY 150-35 MCG/24HR transdermal patch Place 1 patch onto the skin once a week. 06/04/21   [provider]     No family history on file.  Social History   Socioeconomic History   Marital status: Married    Spouse name: Not on file   Number of children: Not on file   Years of education: Not on file   Highest education level: Not on file  Occupational History  Not on file  Tobacco Use   Smoking status: Never   Smokeless tobacco: Never  Vaping Use   Vaping Use: Never used  Substance and Sexual Activity   Alcohol use: Not on file   Drug use: Never   Sexual activity: Not on file  Other Topics Concern   Not on file  Social History Narrative   ** Merged History Encounter **       Social Determinants of Health   Financial Resource Strain: Not on file  Food Insecurity: Not on file  Transportation Needs: Not on file  Physical Activity: Not on file  Stress: Not on file  Social Connections: Not on file      Vital Signs: There were no vitals taken for this visit.  Physical Exam Constitutional:      General: She is not in acute distress. HENT:     Head: Normocephalic.     Mouth/Throat:     Mouth: Mucous membranes are moist.  Cardiovascular:     Rate and Rhythm: Normal rate and regular rhythm.  Pulmonary:     Effort: Pulmonary effort is normal.  Abdominal:     General: There is no distension.  Skin:    General: Skin is warm and dry.  Neurological:     Mental Status: She is alert and oriented to person, place, and time.    Imaging: 07/15/21   08/04/21   Labs:  CBC: Recent Labs    06/15/21 0841 06/17/21 1046 06/18/21 0420 06/20/21 1250  WBC 13.5* 8.1 7.4 10.9*  HGB 13.4 13.0 11.6* 13.4  HCT 39.7 38.9 34.9* 41.1  PLT 222 230 220 297    COAGS: No results for input(s): INR, APTT in the last 8760 hours.  BMP: Recent Labs    06/15/21 0841 06/17/21 1046 06/18/21 0420 06/20/21 1250  NA 136 138 138 137  K 3.5 3.4* 3.9 3.7  CL 103 103 104 105  CO2 23 25 26 23   GLUCOSE 148* 100* 90 122*  BUN 10 11 9 12   CALCIUM 8.9 8.9 8.6* 8.9  CREATININE 0.53 0.70 0.79 0.50  GFRNONAA >60 >60 >60 >60    LIVER FUNCTION TESTS: Recent Labs    06/15/21 0841 06/17/21 1046 06/18/21 0420 06/20/21 1250  BILITOT 0.6 0.7 0.6 0.7  AST 21 20 17 15   ALT 33 27 22 17   ALKPHOS 94 79 67 74  PROT 7.0 7.3 6.2* 7.3  ALBUMIN 4.2 4.0 3.4* 3.7    Assessment and Plan: 51 year old female with history of symptomatic simple hepatic cyst status post aspiration/drainage and ethanol sclerotherapy (07/02/21).  The cyst continues to decrease in size and her pleural effusion has resolved, though she continues to experience some right upper quadrant and right shoulder pain.   I suspect this pain is related to continued cyst collapse and possible re-expansion of her pleura, and will resolve.  We discussed increased over the counter pain medication for several days to see if she can experience any  relief.    She plans to notify me in 2 weeks if the pain is unchanged or worsening.   Otherwise, we will arrange for 3 month follow up visit.  No imaging required at that time.   Electronically Signed: 08/04/2021, 9:32 AM   I spent a total of 40 Minutes in face to face in clinical consultation, greater than 50% of which was counseling/coordinating care for symptomatic hepatic cyst.

## 2021-10-08 ENCOUNTER — Other Ambulatory Visit: Payer: Self-pay | Admitting: Interventional Radiology

## 2021-10-08 DIAGNOSIS — K7689 Other specified diseases of liver: Secondary | ICD-10-CM

## 2021-10-23 ENCOUNTER — Ambulatory Visit
Admission: RE | Admit: 2021-10-23 | Discharge: 2021-10-23 | Disposition: A | Discharge status: Home / Self Care | Payer: 59 | Source: Ambulatory Visit | Attending: Interventional Radiology | Admitting: Interventional Radiology

## 2021-10-23 ENCOUNTER — Other Ambulatory Visit: Payer: Self-pay | Admitting: Interventional Radiology

## 2021-10-23 ENCOUNTER — Encounter: Payer: Self-pay | Admitting: *Deleted

## 2021-10-23 DIAGNOSIS — K7689 Other specified diseases of liver: Secondary | ICD-10-CM

## 2021-10-23 HISTORY — PX: IR RADIOLOGIST EVAL & MGMT: IMG5224

## 2021-10-23 NOTE — Progress Notes (Signed)
? ?Referring Physician(s): ?Allen,Shelby L ?  ?Reason for follow up:   ?Status post hepatic cyst drainage and sclerotherapy ?  ?History of Present Illness: ?51 year old female with history of symptomatic simple hepatic cyst which was drained (1.4 L) on 06/18/21 and sclerosed with dehydrated ethanol on 07/02/21.  She presents today for 3 month follow up via virtual telephone visit. ?  ?She endorses persistent but intermittent RUQ discomfort and right shoulder pain. The pain is less frequent, occurring in daily to weekly intervals.  The pain occurs sharply for approximately 15-20 seconds, is severe, then subsides entirely after about 2 minutes.  She is back to work full time.  She endorses some persistent shortness of breath, especially when climbing stairs.   No fevers or chills, nausea or vomiting. ? ? ? ? ?Past Medical History:  ?Diagnosis Date  ? Complication of anesthesia   ? allergic to local "caines" family  ? Hypertension   ? ? ?Past Surgical History:  ?Procedure Laterality Date  ? IR 3D INDEPENDENT WKST  07/02/2021  ? IR RADIOLOGIST EVAL & MGMT  07/15/2021  ? IR RADIOLOGIST EVAL & MGMT  08/04/2021  ? IR REMOVAL BILIARY DRAIN  07/02/2021  ? IR SCLEROTHERAPY OF A FLUID COLLECTION  07/02/2021  ? KNEE ARTHROSCOPY    ? Meniscus  ? ? ?Allergies: ?Aspercreme [trolamine salicylate], Lidocaine, and Novocain [procaine] ? ?Medications: ?Prior to Admission medications   ?Medication Sig Start Date End Date Taking? Authorizing Provider  ?acetaminophen (TYLENOL) 500 MG tablet Take 1,000 mg by mouth every 6 (six) hours as needed for mild pain.    [provider]  ?Azelastine-Fluticasone 137-50 MCG/ACT SUSP Place 1 spray into both nostrils 2 (two) times daily. 01/07/21   [provider]  ?bisacodyl (DULCOLAX) 5 MG EC tablet Take 2 tablets (10 mg total) by mouth daily. 06/23/21   Alwyn Ren, MD  ?cyclobenzaprine (FLEXERIL) 5 MG tablet Take 1 tablet (5 mg total) by mouth 3 (three) times daily as needed for  muscle spasms. 06/22/21   Alwyn Ren, MD  ?doxycycline (DORYX) 100 MG EC tablet Take 100 mg by mouth 2 (two) times daily. x7days 07/01/21   [provider]  ?escitalopram (LEXAPRO) 20 MG tablet Take 20 mg by mouth every evening. 08/20/20   [provider]  ?Ferrous Sulfate (SLOW FE PO) Take 1 tablet by mouth daily.    [provider]  ?levocetirizine (XYZAL) 5 MG tablet Take 5 mg by mouth every evening.    [provider]  ?montelukast (SINGULAIR) 10 MG tablet Take 10 mg by mouth every evening. 05/01/21   [provider]  ?Multiple Vitamin (MULTIVITAMIN) tablet Take 1 tablet by mouth daily.    [provider]  ?ondansetron (ZOFRAN) 4 MG tablet Take 1 tablet (4 mg total) by mouth every 6 (six) hours as needed for nausea. 06/22/21   Alwyn Ren, MD  ?polyethylene glycol (MIRALAX / GLYCOLAX) 17 g packet Take 17 g by mouth daily. 06/23/21   Alwyn Ren, MD  ?[facility] RESPICLICK [phone] Base) MCG/ACT AEPB Inhale 1 puff into the lungs every 6 (six) hours as needed for wheezing or shortness of breath. 01/07/21   [provider]  ?ZAFEMY 150-35 MCG/24HR transdermal patch Place 1 patch onto the skin once a week. 06/04/21   [provider]  ?  ? ?No family history on file. ? ?Social History  ? ?Socioeconomic History  ? Marital status: Married  ?  Spouse name: Not  on file  ? Number of children: Not on file  ? Years of education: Not on file  ? Highest education level: Not on file  ?Occupational History  ? Not on file  ?Tobacco Use  ? Smoking status: Never  ? Smokeless tobacco: Never  ?Vaping Use  ? Vaping Use: Never used  ?Substance and Sexual Activity  ? Alcohol use: Not on file  ? Drug use: Never  ? Sexual activity: Not on file  ?Other Topics Concern  ? Not on file  ?Social History Narrative  ? ** Merged History Encounter **  ?    ? ?Social Determinants of Health  ? ?Financial Resource Strain: Not on file  ?Food Insecurity: Not on file   ?Transportation Needs: Not on file  ?Physical Activity: Not on file  ?Stress: Not on file  ?Social Connections: Not on file  ? ? ? ?Vital Signs: ?There were no vitals taken for this visit. ? ?No physical examination was performed in lieu of virtual telephone clinic visit. ? ? ?Imaging: ?No results found. ? ?Labs: ? ?CBC: ?Recent Labs  ?  06/15/21 ?0841 06/17/21 ?1046 06/18/21 ?0420 06/20/21 ?1250  ?WBC 13.5* 8.1 7.4 10.9*  ?HGB 13.4 13.0 11.6* 13.4  ?HCT 39.7 38.9 34.9* 41.1  ?PLT 222 230 220 297  ? ? ?COAGS: ?No results for input(s): INR, APTT in the last 8760 hours. ? ?BMP: ?Recent Labs  ?  06/15/21 ?0841 06/17/21 ?1046 06/18/21 ?0420 06/20/21 ?1250  ?NA 136 138 138 137  ?K 3.5 3.4* 3.9 3.7  ?CL 103 103 104 105  ?CO2 23 25 26 23   ?GLUCOSE 148* 100* 90 122*  ?BUN 10 11 9 12   ?CALCIUM 8.9 8.9 8.6* 8.9  ?CREATININE 0.53 0.70 0.79 0.50  ?GFRNONAA >60 >60 >60 >60  ? ? ?LIVER FUNCTION TESTS: ?Recent Labs  ?  06/15/21 ?0841 06/17/21 ?1046 06/18/21 ?0420 06/20/21 ?1250  ?BILITOT 0.6 0.7 0.6 0.7  ?AST 21 20 17 15   ?ALT 33 27 22 17   ?ALKPHOS 94 79 67 74  ?PROT 7.0 7.3 6.2* 7.3  ?ALBUMIN 4.2 4.0 3.4* 3.7  ? ? ?Assessment and Plan: ?51 year old female with history of symptomatic simple hepatic cyst status post aspiration/drainage and ethanol sclerotherapy (07/02/21).  She continues to experience some intermittent right upper quadrant and right shoulder pain.  ?  ?I expected this pain to resolve entirely by now.  I am suspicious of persistent cyst vs re-accumulation.    ? ?Plan for CT abdomen with contrast and follow up telephone visit with me once complete to discuss findings.  We discussed the possibility of repeat single-session sclerotherapy if enlarging. ? ? ?Electronically Signed: ?Taela Charbonneau J Lizzett Nobile ?10/23/2021, 8:29 AM ? ? ?I spent a total of 25 Minutes in telephone clinical consultation, greater than 50% of which was counseling/coordinating care for symptomatic hepatic cyst. ? ? ? ? ? ?

## 2021-10-26 ENCOUNTER — Ambulatory Visit (HOSPITAL_BASED_OUTPATIENT_CLINIC_OR_DEPARTMENT_OTHER)
Admission: RE | Admit: 2021-10-26 | Discharge: 2021-10-26 | Disposition: A | Discharge status: Home / Self Care | Payer: 59 | Source: Ambulatory Visit | Attending: Interventional Radiology | Admitting: Interventional Radiology

## 2021-10-26 ENCOUNTER — Encounter (HOSPITAL_BASED_OUTPATIENT_CLINIC_OR_DEPARTMENT_OTHER): Payer: Self-pay

## 2021-10-26 DIAGNOSIS — K7689 Other specified diseases of liver: Secondary | ICD-10-CM | POA: Diagnosis present

## 2021-10-26 MED ORDER — IOHEXOL 300 MG/ML  SOLN
100.0000 mL | Freq: Once | INTRAMUSCULAR | Status: AC | PRN
  Administered 2021-10-26: 80 mL via INTRAVENOUS

## 2021-11-05 ENCOUNTER — Ambulatory Visit
Admission: RE | Admit: 2021-11-05 | Discharge: 2021-11-05 | Disposition: A | Discharge status: Home / Self Care | Payer: 59 | Source: Ambulatory Visit | Attending: Interventional Radiology | Admitting: Interventional Radiology

## 2021-11-05 DIAGNOSIS — K7689 Other specified diseases of liver: Secondary | ICD-10-CM

## 2021-11-05 HISTORY — PX: IR RADIOLOGIST EVAL & MGMT: IMG5224

## 2021-11-05 NOTE — Progress Notes (Signed)
    Referring Physician(s): Allen,Shelby L   Reason for follow up:   Status post hepatic cyst drainage and sclerotherapy, virtual telephone visit   History of Present Illness: 51 year old female with history of symptomatic simple hepatic cyst which was drained (1.4 L) on 06/18/21 and sclerosed with dehydrated ethanol on 07/02/21. She presents today for review of recent CT abdomen after presenting with persistent pain.   Since her last visit, her pain has subsided.  She has not experienced it since May 7.  She reports less and less frequent episodes of pain - primarily in the RUQ and right shoulder.  Her shortness of breath is improving as she gets back to work and increased levels of exercise.     Vital Signs: There were no vitals taken for this visit.  No physical examination was performed in lieu of virtual telephone clinic visit.   Imaging: CT AP 08/14/21   CT AP 10/26/21   Labs: No new pertinent labs.  Assessment and Plan: 51 year old female with history of symptomatic simple hepatic cyst status post aspiration/drainage and ethanol sclerotherapy (07/02/21).  Her residual pain continues to become less frequent and severe, and CT imaging demonstrates continued resolution of sclerosed cyst in the right hepatic dome.  Given location, the intermittent pains likely represent continued healing/diaphragmatic irritation.  Plan to follow up in 6 months with repeat CT abdomen with contrast, or sooner if needed.  Electronically Signed: Bennie Dallas, MD 11/05/2021, 4:01 PM   I spent a total of 15 Minutes in telephone clinical consultation, greater than 50% of which was counseling/coordinating care for symptomatic hepatic cyst.

## 2021-11-06 ENCOUNTER — Encounter: Payer: Self-pay | Admitting: *Deleted

## 2022-03-31 ENCOUNTER — Other Ambulatory Visit: Payer: Self-pay | Admitting: Interventional Radiology

## 2022-03-31 DIAGNOSIS — K7689 Other specified diseases of liver: Secondary | ICD-10-CM

## 2022-05-12 ENCOUNTER — Encounter (HOSPITAL_BASED_OUTPATIENT_CLINIC_OR_DEPARTMENT_OTHER): Payer: Self-pay

## 2022-05-12 ENCOUNTER — Ambulatory Visit (HOSPITAL_BASED_OUTPATIENT_CLINIC_OR_DEPARTMENT_OTHER)
Admission: RE | Admit: 2022-05-12 | Discharge: 2022-05-12 | Disposition: A | Discharge status: Home / Self Care | Payer: 59 | Source: Ambulatory Visit | Attending: Interventional Radiology | Admitting: Interventional Radiology

## 2022-05-12 DIAGNOSIS — K7689 Other specified diseases of liver: Secondary | ICD-10-CM | POA: Diagnosis not present

## 2022-05-12 MED ORDER — IOHEXOL 300 MG/ML  SOLN
100.0000 mL | Freq: Once | INTRAMUSCULAR | Status: AC | PRN
  Administered 2022-05-12: 85 mL via INTRAVENOUS

## 2022-05-17 ENCOUNTER — Ambulatory Visit
Admission: RE | Admit: 2022-05-17 | Discharge: 2022-05-17 | Disposition: A | Discharge status: Home / Self Care | Payer: 59 | Source: Ambulatory Visit | Attending: Interventional Radiology | Admitting: Interventional Radiology

## 2022-05-17 DIAGNOSIS — K7689 Other specified diseases of liver: Secondary | ICD-10-CM

## 2022-05-17 HISTORY — PX: IR RADIOLOGIST EVAL & MGMT: IMG5224

## 2022-05-17 NOTE — Progress Notes (Signed)
Referring Physician(s): Allen,Shelby L   Reason for follow up:   Status post hepatic cyst drainage and sclerotherapy, virtual telephone visit   History of Present Illness: 51 year old female with history of symptomatic simple hepatic cyst which was drained (1.4 L) on 06/18/21 and sclerosed with dehydrated ethanol on 07/02/21. She presents today for review of recent follow up CT abdomen.   She continues to endorse intermittent RUQ and right shoulder pain.  This has occurred approximately 3-4 times over the past 2 months.  The pain stops her in her tracks and requires about a minute to subside.  She can't identify any exacerbating factors.  No fevers or chills.   Past Medical History:  Diagnosis Date   Complication of anesthesia    allergic to local "caines" family   Hypertension     Past Surgical History:  Procedure Laterality Date   IR 3D INDEPENDENT WKST  07/02/2021   IR RADIOLOGIST EVAL & MGMT  07/15/2021   IR RADIOLOGIST EVAL & MGMT  08/04/2021   IR RADIOLOGIST EVAL & MGMT  10/23/2021   IR RADIOLOGIST EVAL & MGMT  11/05/2021   IR REMOVAL BILIARY DRAIN  07/02/2021   IR SCLEROTHERAPY OF A FLUID COLLECTION  07/02/2021   KNEE ARTHROSCOPY     Meniscus    Allergies: Aspercreme [trolamine salicylate], Lidocaine, and Novocain [procaine]  Medications: Prior to Admission medications   Medication Sig Start Date End Date Taking? Authorizing Provider  acetaminophen (TYLENOL) 500 MG tablet Take 1,000 mg by mouth every 6 (six) hours as needed for mild pain.    [provider]  Azelastine-Fluticasone 137-50 MCG/ACT SUSP Place 1 spray into both nostrils 2 (two) times daily. 01/07/21   [provider]  bisacodyl (DULCOLAX) 5 MG EC tablet Take 2 tablets (10 mg total) by mouth daily. 06/23/21   Alwyn Ren, MD  cyclobenzaprine (FLEXERIL) 5 MG tablet Take 1 tablet (5 mg total) by mouth 3 (three) times daily as needed for muscle spasms. 06/22/21   Alwyn Ren, MD   doxycycline (DORYX) 100 MG EC tablet Take 100 mg by mouth 2 (two) times daily. x7days 07/01/21   [provider]  escitalopram (LEXAPRO) 20 MG tablet Take 20 mg by mouth every evening. 08/20/20   [provider]  Ferrous Sulfate (SLOW FE PO) Take 1 tablet by mouth daily.    [provider]  levocetirizine (XYZAL) 5 MG tablet Take 5 mg by mouth every evening.    [provider]  montelukast (SINGULAIR) 10 MG tablet Take 10 mg by mouth every evening. 05/01/21   [provider]  Multiple Vitamin (MULTIVITAMIN) tablet Take 1 tablet by mouth daily.    [provider]  ondansetron (ZOFRAN) 4 MG tablet Take 1 tablet (4 mg total) by mouth every 6 (six) hours as needed for nausea. 06/22/21   Alwyn Ren, MD  polyethylene glycol (MIRALAX / GLYCOLAX) 17 g packet Take 17 g by mouth daily. 06/23/21   Alwyn Ren, MD  PROAIR RESPICLICK 108 [phone] Base) MCG/ACT AEPB Inhale 1 puff into the lungs every 6 (six) hours as needed for wheezing or shortness of breath. 01/07/21   [provider]  ZAFEMY 150-35 MCG/24HR transdermal patch Place 1 patch onto the skin once a week. 06/04/21   [provider]     No family history on file.  Social History   Socioeconomic History   Marital status: Married    Spouse name: Not on file  Number of children: Not on file   Years of education: Not on file   Highest education level: Not on file  Occupational History   Not on file  Tobacco Use   Smoking status: Never   Smokeless tobacco: Never  Vaping Use   Vaping Use: Never used  Substance and Sexual Activity   Alcohol use: Not on file   Drug use: Never   Sexual activity: Not on file  Other Topics Concern   Not on file  Social History Narrative   ** Merged History Encounter **       Social Determinants of Health   Financial Resource Strain: Not on file  Food Insecurity: Not on file  Transportation Needs: Not on file  Physical  Activity: Not on file  Stress: Not on file  Social Connections: Not on file     Vital Signs: There were no vitals taken for this visit.  No physical examination was performed in lieu of virtual telephone clinic visit.  Imaging: CT AP 08/14/21    CT AP 10/26/21   CT AP 05/17/22   Labs: No recent, pertinent labs.  Assessment and Plan: 51 year old female with history of symptomatic simple hepatic cyst status post aspiration/drainage and ethanol sclerotherapy (07/02/21).  She continues to have intermittent RUQ and right shoulder pain which occurs a few times per month, lasting a minute then resolving. CT imaging demonstrates continued resolution of sclerosed cyst in the right hepatic dome.  Given location, the intermittent pains again likely represent continued healing/diaphragmatic irritation.   Plan to follow up in 6-8 months with repeat CT abdomen with contrast, or sooner if needed.  Electronically Signed: Bennie Dallas 05/17/2022, 1:23 PM   I spent a total of 25 Minutes in virtual telephone clinical consultation, greater than 50% of which was counseling/coordinating care for hepatic cyst.

## 2022-08-27 ENCOUNTER — Other Ambulatory Visit: Payer: Self-pay | Admitting: Family Medicine

## 2022-08-27 DIAGNOSIS — I77810 Thoracic aortic ectasia: Secondary | ICD-10-CM

## 2022-09-15 ENCOUNTER — Other Ambulatory Visit: Payer: Self-pay | Admitting: Pain Medicine

## 2022-09-15 DIAGNOSIS — Z1231 Encounter for screening mammogram for malignant neoplasm of breast: Secondary | ICD-10-CM

## 2022-09-17 ENCOUNTER — Ambulatory Visit
Admission: RE | Admit: 2022-09-17 | Discharge: 2022-09-17 | Disposition: A | Discharge status: Home / Self Care | Payer: 59 | Source: Ambulatory Visit | Attending: Family Medicine | Admitting: Family Medicine

## 2022-09-17 DIAGNOSIS — I77810 Thoracic aortic ectasia: Secondary | ICD-10-CM

## 2022-09-17 MED ORDER — GADOPICLENOL 0.5 MMOL/ML IV SOLN
7.5000 mL | Freq: Once | INTRAVENOUS | Status: AC | PRN
  Administered 2022-09-17: 7.5 mL via INTRAVENOUS

## 2022-09-30 ENCOUNTER — Ambulatory Visit (HOSPITAL_BASED_OUTPATIENT_CLINIC_OR_DEPARTMENT_OTHER)
Admission: RE | Admit: 2022-09-30 | Discharge: 2022-09-30 | Disposition: A | Discharge status: Home / Self Care | Payer: 59 | Source: Ambulatory Visit | Attending: Pain Medicine | Admitting: Pain Medicine

## 2022-09-30 ENCOUNTER — Other Ambulatory Visit (HOSPITAL_BASED_OUTPATIENT_CLINIC_OR_DEPARTMENT_OTHER): Payer: Self-pay | Admitting: Pain Medicine

## 2022-09-30 DIAGNOSIS — E663 Overweight: Secondary | ICD-10-CM

## 2022-10-20 ENCOUNTER — Ambulatory Visit
Admission: RE | Admit: 2022-10-20 | Discharge: 2022-10-20 | Disposition: A | Discharge status: Home / Self Care | Payer: 59 | Source: Ambulatory Visit | Attending: Pain Medicine | Admitting: Pain Medicine

## 2022-10-20 DIAGNOSIS — Z1231 Encounter for screening mammogram for malignant neoplasm of breast: Secondary | ICD-10-CM

## 2023-01-20 ENCOUNTER — Other Ambulatory Visit: Payer: Self-pay | Admitting: Interventional Radiology

## 2023-01-20 DIAGNOSIS — K7689 Other specified diseases of liver: Secondary | ICD-10-CM

## 2023-02-11 ENCOUNTER — Ambulatory Visit
Admission: RE | Admit: 2023-02-11 | Discharge: 2023-02-11 | Disposition: A | Discharge status: Home / Self Care | Payer: 59 | Source: Ambulatory Visit | Attending: Interventional Radiology | Admitting: Interventional Radiology

## 2023-02-11 DIAGNOSIS — K7689 Other specified diseases of liver: Secondary | ICD-10-CM

## 2023-02-11 MED ORDER — IOPAMIDOL (ISOVUE-300) INJECTION 61%
100.0000 mL | Freq: Once | INTRAVENOUS | Status: AC | PRN
  Administered 2023-02-11: 100 mL via INTRAVENOUS

## 2023-02-25 ENCOUNTER — Ambulatory Visit
Admission: RE | Admit: 2023-02-25 | Discharge: 2023-02-25 | Disposition: A | Discharge status: Home / Self Care | Payer: 59 | Source: Ambulatory Visit | Attending: Interventional Radiology

## 2023-02-25 DIAGNOSIS — K7689 Other specified diseases of liver: Secondary | ICD-10-CM

## 2023-02-25 HISTORY — PX: IR RADIOLOGIST EVAL & MGMT: IMG5224

## 2023-02-25 NOTE — Progress Notes (Signed)
Referring Physician(s): Allen,Shelby L   Reason for follow up:   Status post hepatic cyst drainage and sclerotherapy, virtual telephone visit   History of Present Illness: 52 year old female with history of symptomatic simple hepatic cyst which was drained (1.4 L) on 06/18/21 and sclerosed with dehydrated ethanol on 07/02/21. She presents today for review of recent follow up CT abdomen.   She continues to endorse intermittent RUQ and right shoulder pain, slightly decreased from prior.  This has occurred approximately 2 times/month.  The intensity of the pain has subsided slightly.  She again can't identify any exacerbating factors.  No fevers or chills.   Past Medical History:  Diagnosis Date   Complication of anesthesia    allergic to local "caines" family   Hypertension     Past Surgical History:  Procedure Laterality Date   IR 3D INDEPENDENT WKST  07/02/2021   IR RADIOLOGIST EVAL & MGMT  07/15/2021   IR RADIOLOGIST EVAL & MGMT  08/04/2021   IR RADIOLOGIST EVAL & MGMT  10/23/2021   IR RADIOLOGIST EVAL & MGMT  11/05/2021   IR RADIOLOGIST EVAL & MGMT  05/17/2022   IR REMOVAL BILIARY DRAIN  07/02/2021   IR SCLEROTHERAPY OF A FLUID COLLECTION  07/02/2021   KNEE ARTHROSCOPY     Meniscus    Allergies: Aspercreme [trolamine salicylate], Lidocaine, and Novocain [procaine]  Medications: Prior to Admission medications   Medication Sig Start Date End Date Taking? Authorizing Provider  acetaminophen (TYLENOL) 500 MG tablet Take 1,000 mg by mouth every 6 (six) hours as needed for mild pain.    [provider]  Azelastine-Fluticasone 137-50 MCG/ACT SUSP Place 1 spray into both nostrils 2 (two) times daily. 01/07/21   [provider]  bisacodyl (DULCOLAX) 5 MG EC tablet Take 2 tablets (10 mg total) by mouth daily. 06/23/21   Alwyn Ren, MD  cyclobenzaprine (FLEXERIL) 5 MG tablet Take 1 tablet (5 mg total) by mouth 3 (three) times daily as needed for muscle spasms.  06/22/21   Alwyn Ren, MD  doxycycline (DORYX) 100 MG EC tablet Take 100 mg by mouth 2 (two) times daily. x7days 07/01/21   [provider]  escitalopram (LEXAPRO) 20 MG tablet Take 20 mg by mouth every evening. 08/20/20   [provider]  Ferrous Sulfate (SLOW FE PO) Take 1 tablet by mouth daily.    [provider]  levocetirizine (XYZAL) 5 MG tablet Take 5 mg by mouth every evening.    [provider]  montelukast (SINGULAIR) 10 MG tablet Take 10 mg by mouth every evening. 05/01/21   [provider]  Multiple Vitamin (MULTIVITAMIN) tablet Take 1 tablet by mouth daily.    [provider]  ondansetron (ZOFRAN) 4 MG tablet Take 1 tablet (4 mg total) by mouth every 6 (six) hours as needed for nausea. 06/22/21   Alwyn Ren, MD  polyethylene glycol (MIRALAX / GLYCOLAX) 17 g packet Take 17 g by mouth daily. 06/23/21   Alwyn Ren, MD  PROAIR RESPICLICK 108 [phone] Base) MCG/ACT AEPB Inhale 1 puff into the lungs every 6 (six) hours as needed for wheezing or shortness of breath. 01/07/21   [provider]  ZAFEMY 150-35 MCG/24HR transdermal patch Place 1 patch onto the skin once a week. 06/04/21   [provider]     No family history on file.  Social History   Socioeconomic History   Marital status: Married    Spouse name: Not  on file   Number of children: Not on file   Years of education: Not on file   Highest education level: Not on file  Occupational History   Not on file  Tobacco Use   Smoking status: Never   Smokeless tobacco: Never  Vaping Use   Vaping status: Never Used  Substance and Sexual Activity   Alcohol use: Not on file   Drug use: Never   Sexual activity: Not on file  Other Topics Concern   Not on file  Social History Narrative   ** Merged History Encounter **       Social Determinants of Health   Financial Resource Strain: Not on file  Food Insecurity: Not on file   Transportation Needs: Not on file  Physical Activity: Not on file  Stress: Not on file  Social Connections: Not on file     Vital Signs: There were no vitals taken for this visit.  No physical examination was performed in lieu of virtual telephone clinic visit.  Imaging: CT AP 08/14/21    CT AP 10/26/21    CT AP 05/17/22   CT AP 02/11/23   Labs:  CBC: No results for input(s): "WBC", "HGB", "HCT", "PLT" in the last 8760 hours.  COAGS: No results for input(s): "INR", "APTT" in the last 8760 hours.  BMP: No results for input(s): "NA", "K", "CL", "CO2", "GLUCOSE", "BUN", "CALCIUM", "CREATININE", "GFRNONAA", "GFRAA" in the last 8760 hours.  Invalid input(s): "CMP"  LIVER FUNCTION TESTS: No results for input(s): "BILITOT", "AST", "ALT", "ALKPHOS", "PROT", "ALBUMIN" in the last 8760 hours.  Assessment and Plan: 52 year old female with history of symptomatic simple hepatic cyst status post aspiration/drainage and ethanol sclerotherapy (07/02/21).  She continues to have some intermittent RUQ and right shoulder pain which occurs about once a month. CT imaging demonstrates continued resolution of sclerosed cyst in the right hepatic dome.  Given location, the intermittent pains again likely represents continued healing/diaphragmatic irritation.   Plan to follow up in 1 year with repeat CT abdomen with contrast.   Electronically Signed: Thressa Sheller Sagrario Lineberry 02/25/2023, 10:24 AM   I spent a total of 25 Minutes in virtual telephone clinical consultation, greater than 50% of which was counseling/coordinating care for hepatic cyst.

## 2023-10-13 IMAGING — CT CT ABDOMEN W/ CM
2 of 4 series · 12 of 46 positions shown, 14 images · IV contrast (agent unspecified)
Comparison: 06/15/2021, 06/18/2021, 07/02/2021, 07/15/2021

CLINICAL DATA: 50-year-old female with history of symptomatic right
hepatic cyst status post percutaneous drainage and ethanol
sclerotherapy on 07/02/2021. She presents today with persistent
right upper quadrant diaphragmatic pain.

EXAM:
CT ABDOMEN WITH CONTRAST
TECHNIQUE: Multidetector CT imaging of the abdomen was performed using the
standard protocol following bolus administration of intravenous
contrast.

[Series 2: abd with 5.00 br40 s3 axial · axial · 0.51mm/px · z∈[+1298,+1543]mm · 9 of 61 slices shown, 11 images]
[im 6/61  soft-tissue]
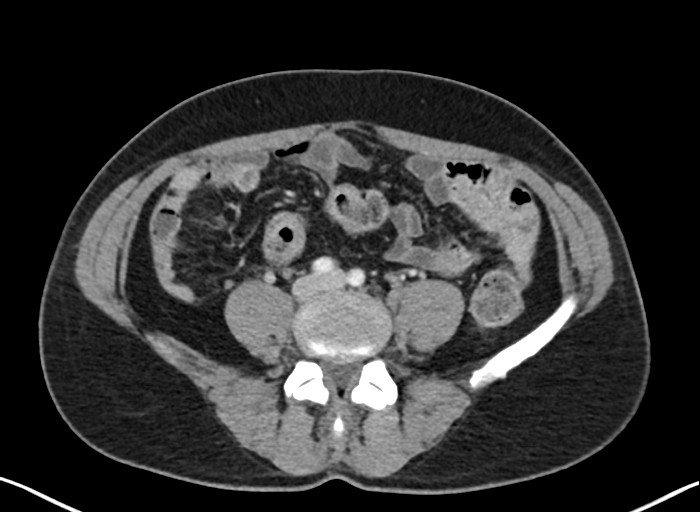
[im 6/61  bone]
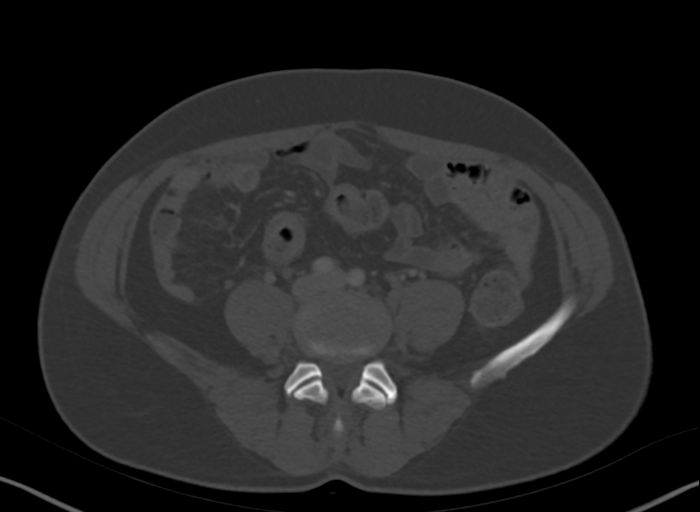
[im 12/61  soft-tissue]
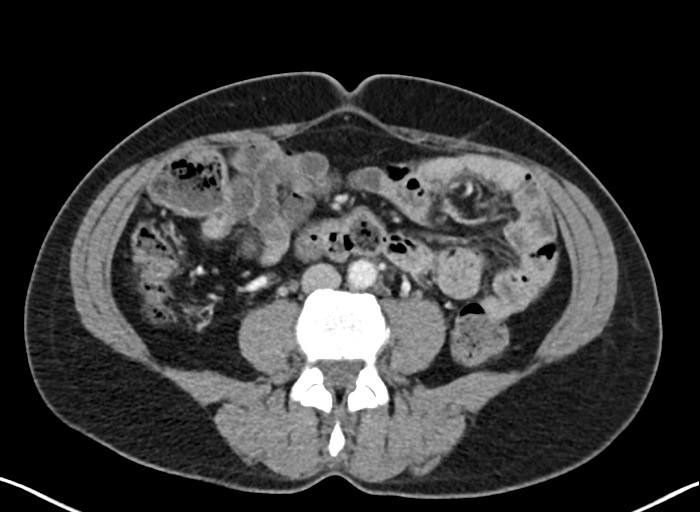
[im 18/61  soft-tissue]
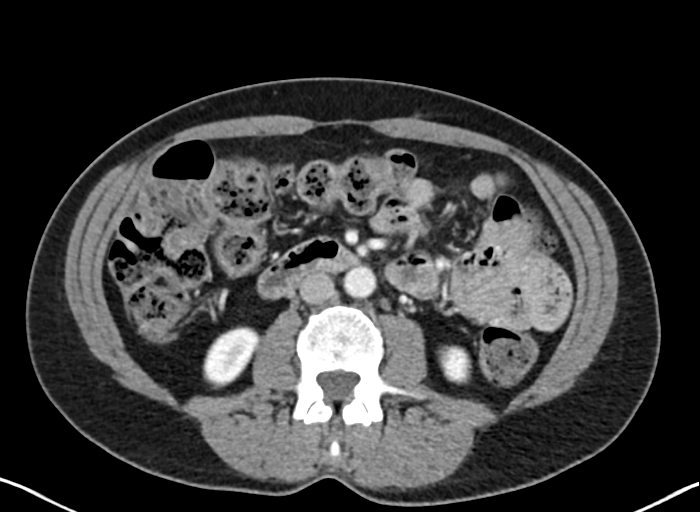
[im 23/61  soft-tissue]
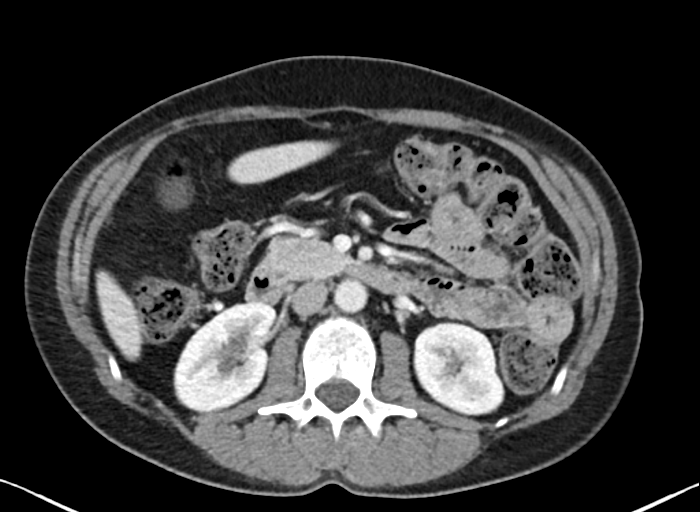
[im 32/61  soft-tissue]
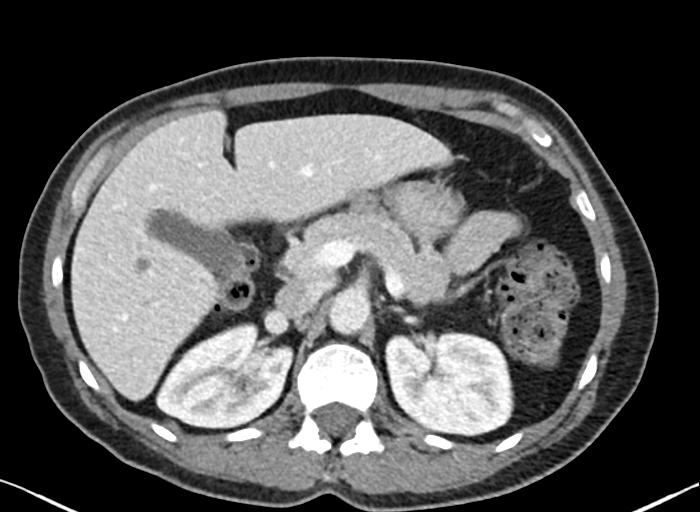
[im 38/61  soft-tissue]
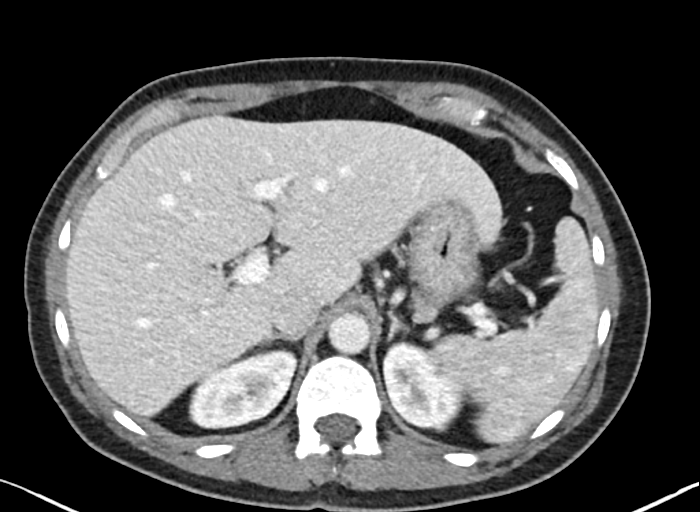
[im 43/61  soft-tissue]
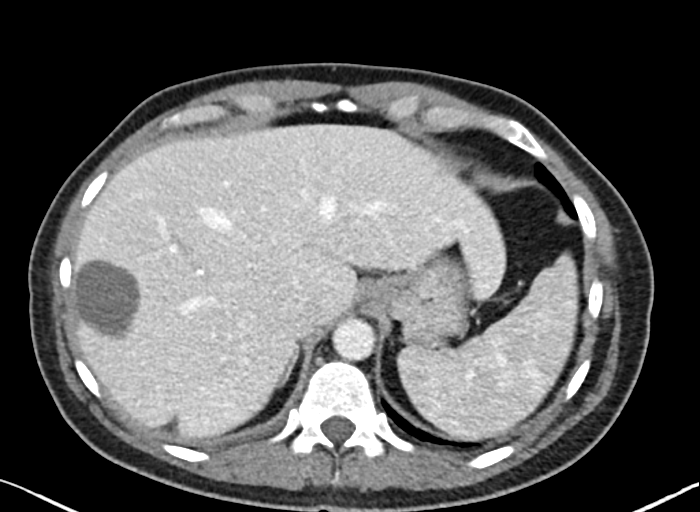
[im 49/61  soft-tissue]
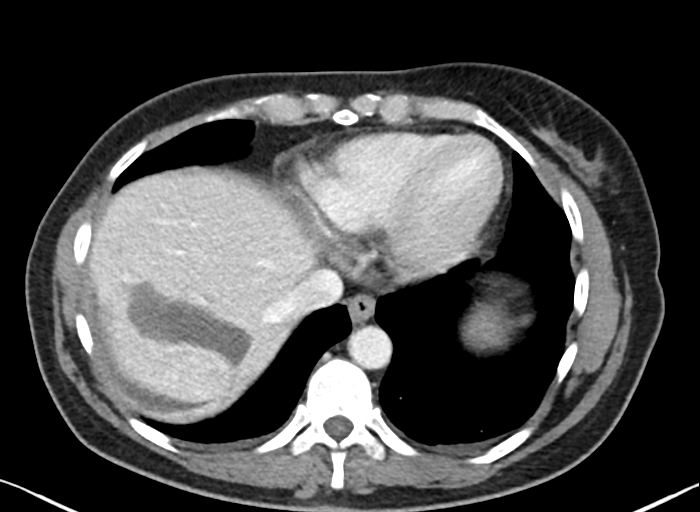
[im 55/61  soft-tissue]
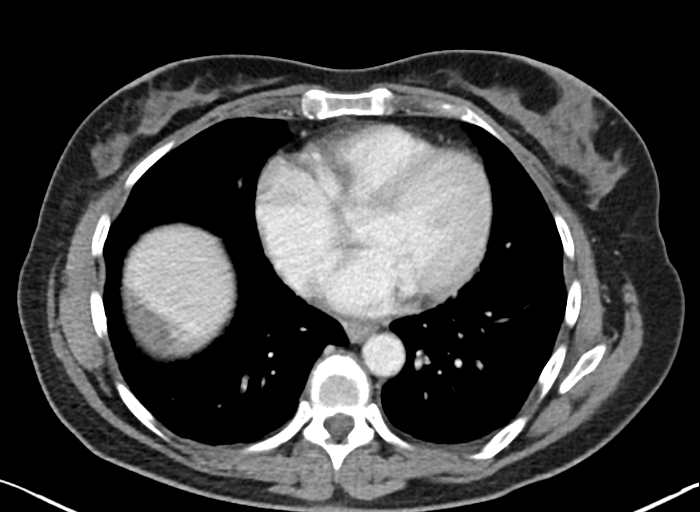
[im 55/61  bone]
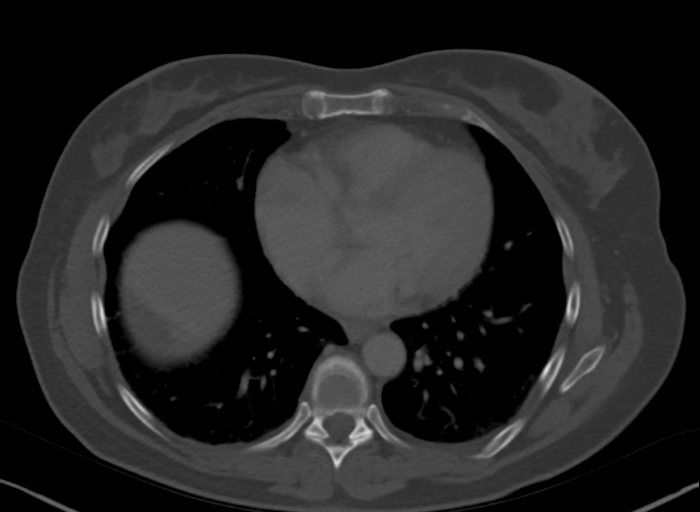

[Series 8: abd with 2.00 br40 s3 cor · coronal · 0.60mm/px · 3 of 120 slices shown]
[im 40/120  soft-tissue]
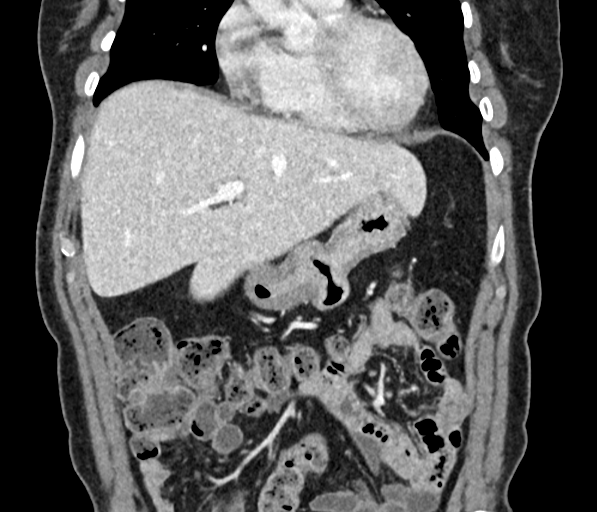
[im 53/120  soft-tissue]
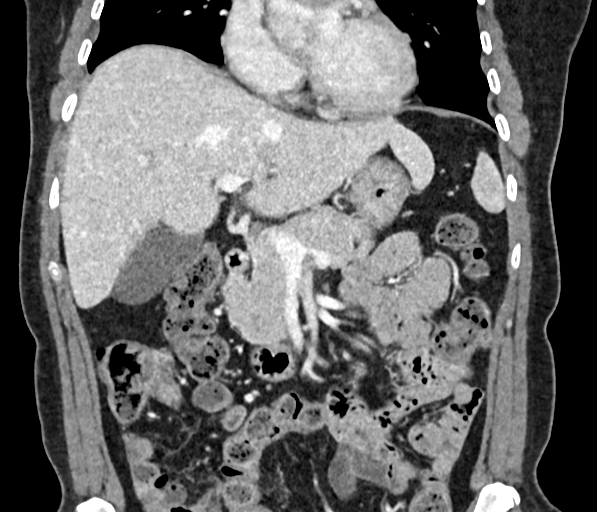
[im 67/120  soft-tissue]
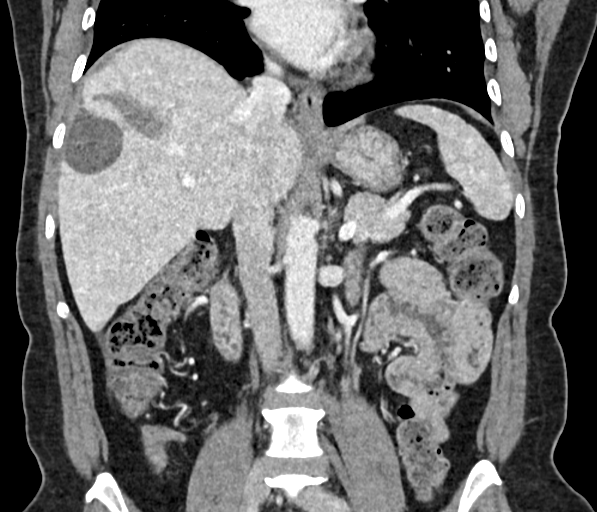

[12 of 46 positions shown; findings below may reference images not displayed]

RADIATION DOSE REDUCTION: This exam was performed according to the
departmental dose-optimization program which includes automated
exposure control, adjustment of the mA and/or kV according to
patient size and/or use of iterative reconstruction technique.

CONTRAST:  100mL EDKC2P-KFF IOPAMIDOL (EDKC2P-KFF) INJECTION 61%
FINDINGS: Lower chest: Resolution of previously visualized trace right pleural
effusion. No acute abnormality.

Hepatobiliary: The liver is normal in size, contour, and
attenuation. The residual dominant, subcapsular right hepatic cyst
is decreased in size, now measuring approximately 4.9 x 7.0 x 4.4 cm
(AP by trans by CC, most recently measuring 6.9 x 7.9 x 5.5 cm.
Similar appearance of a few additional scattered simple hepatic
cysts, the largest inferior to the sclerosed cyst which measures up
to 4.3 cm in greatest axial dimension. No focal mass is visualized.
The gallbladder is present unremarkable. No intra or extrahepatic
biliary ductal dilation.

Pancreas: Unremarkable. No pancreatic ductal dilatation or
surrounding inflammatory changes.

Spleen: Normal in size without focal abnormality.

Adrenals/Urinary Tract: Adrenal glands are unremarkable. Kidneys are
normal, without renal calculi, focal lesion, or hydronephrosis.

Stomach/Bowel: Stomach is within normal limits. No evidence of bowel
wall thickening, distention, or inflammatory changes.

Vascular/Lymphatic: No significant vascular findings are present. No
enlarged abdominal lymph nodes.

Other: No abdominal wall hernia or abnormality. No abdominopelvic
ascites.

Musculoskeletal: No acute or significant osseous findings.
IMPRESSION: 1. Interval decreased size previously drained and sclerosed right
hepatic cyst. No complicating features to explain persistent pain.
2. Resolution of previously visualized trace right pleural effusion.

## 2023-11-21 ENCOUNTER — Other Ambulatory Visit: Payer: Self-pay | Admitting: Family Medicine

## 2023-11-21 DIAGNOSIS — Z1231 Encounter for screening mammogram for malignant neoplasm of breast: Secondary | ICD-10-CM

## 2023-11-25 ENCOUNTER — Ambulatory Visit
Admission: RE | Admit: 2023-11-25 | Discharge: 2023-11-25 | Disposition: A | Discharge status: Home / Self Care | Source: Ambulatory Visit | Attending: Family Medicine | Admitting: Family Medicine

## 2023-11-25 DIAGNOSIS — Z1231 Encounter for screening mammogram for malignant neoplasm of breast: Secondary | ICD-10-CM

## 2024-01-04 IMAGING — CT CT ABDOMEN WO/W CM
3 of 12 series · 12 of 46 positions shown, 18 images · IV contrast (APPLIED)
Comparison: CT abdomen dated August 04, 2021

CLINICAL DATA: Follow-up hepatic cysts

EXAM:
CT ABDOMEN WITHOUT AND WITH CONTRAST
TECHNIQUE: Multidetector CT imaging of the abdomen was performed following the
standard protocol before and following the bolus administration of
intravenous contrast.

[Series 5: liver arterial · axial · arterial · 0.68mm/px · z∈[+917,+1043]mm · 4 of 149 slices shown]
[im 22/149  soft-tissue]
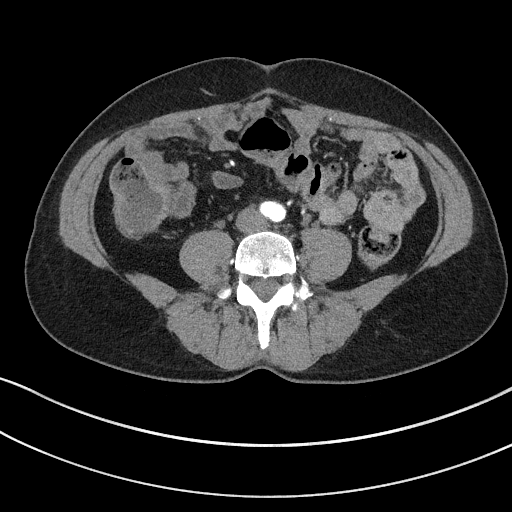
[im 43/149  soft-tissue]
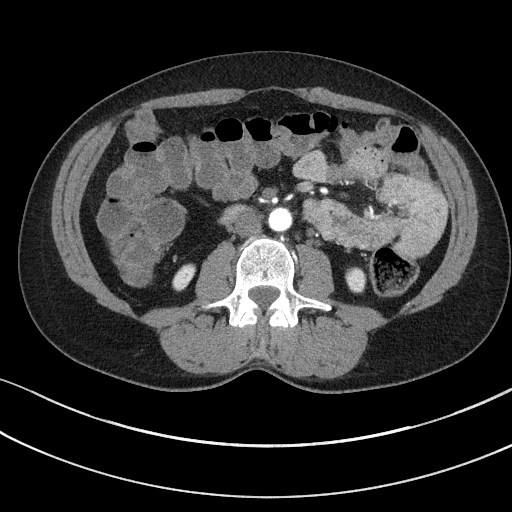
[im 64/149  soft-tissue]
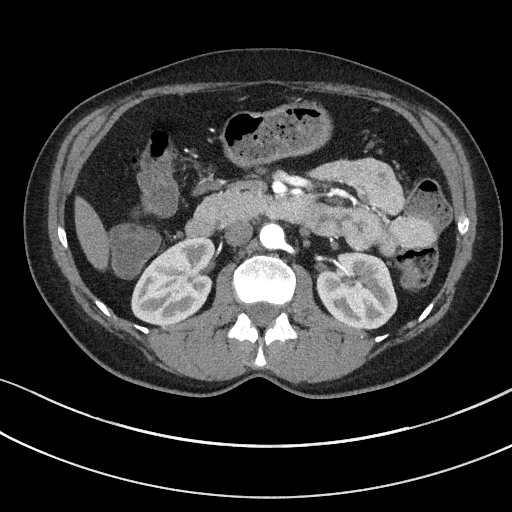
[im 85/149  soft-tissue]
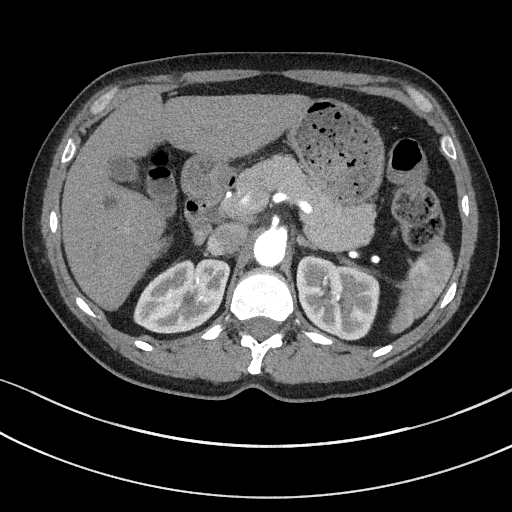

[Series 8: cor arterial · coronal · arterial · 0.58mm/px · 2 of 125 slices shown, 3 images]
[im 42/125  soft-tissue]
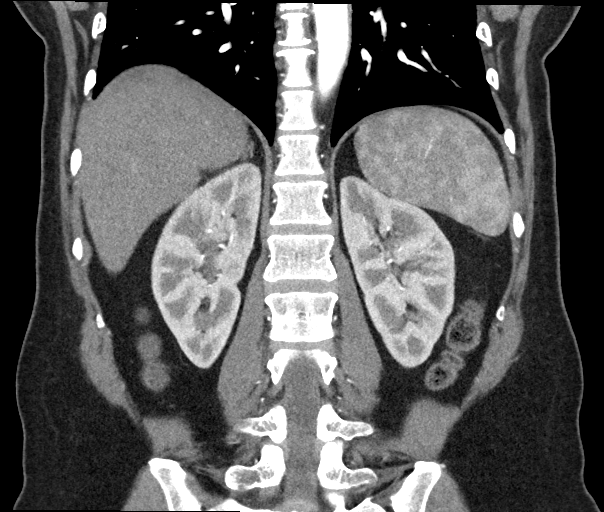
[im 42/125  bone]
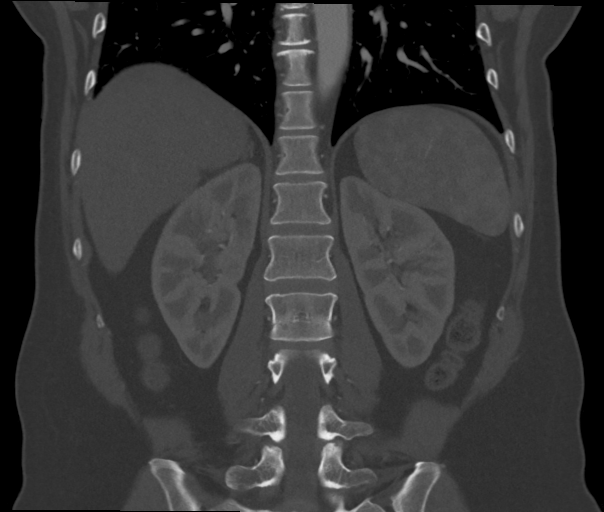
[im 83/125  soft-tissue]
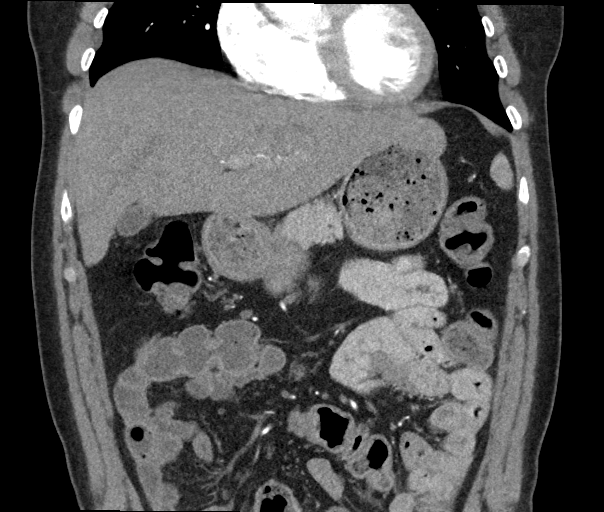

[Series 10: liver portal venous · axial · portal-venous · 0.68mm/px · z∈[+917,+1127]mm · 6 of 149 slices shown, 11 images]
[im 22/149  soft-tissue]
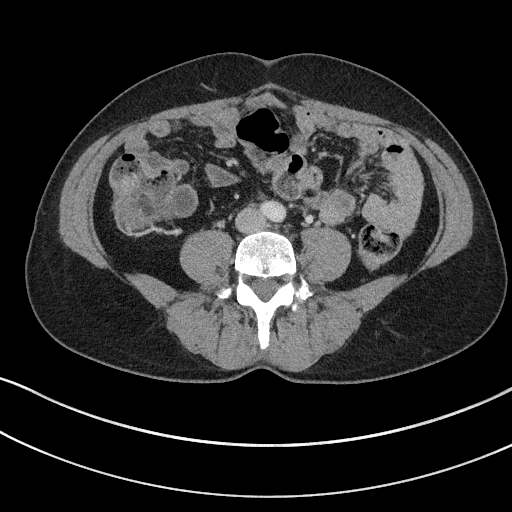
[im 22/149  bone]
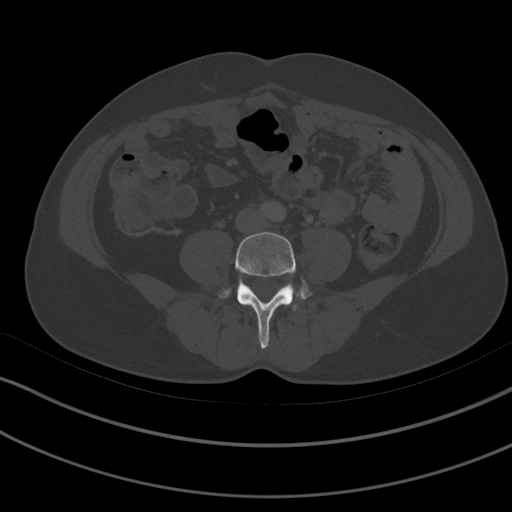
[im 43/149  soft-tissue]
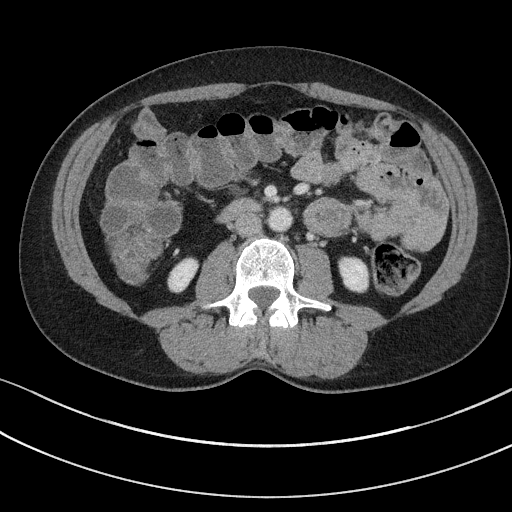
[im 64/149  soft-tissue]
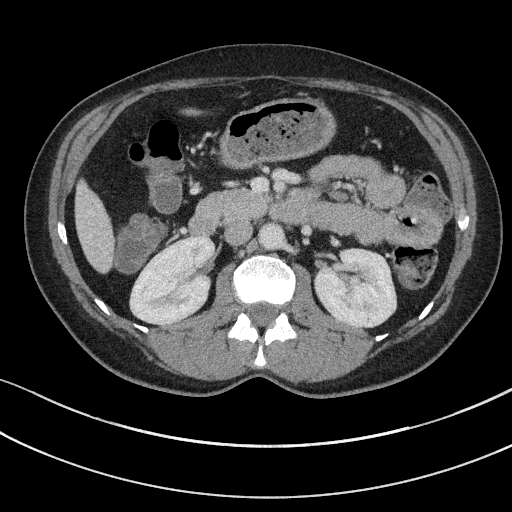
[im 64/149  lung]
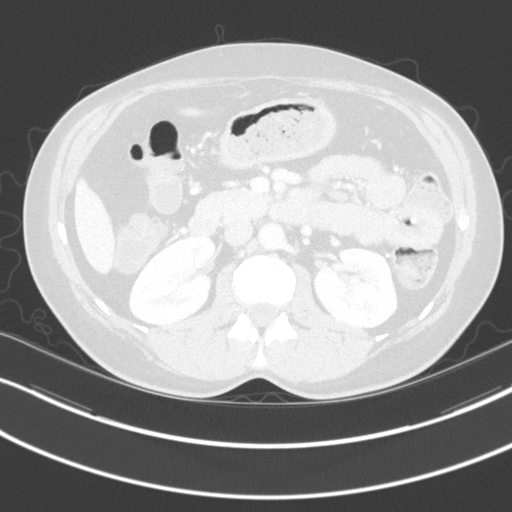
[im 85/149  soft-tissue]
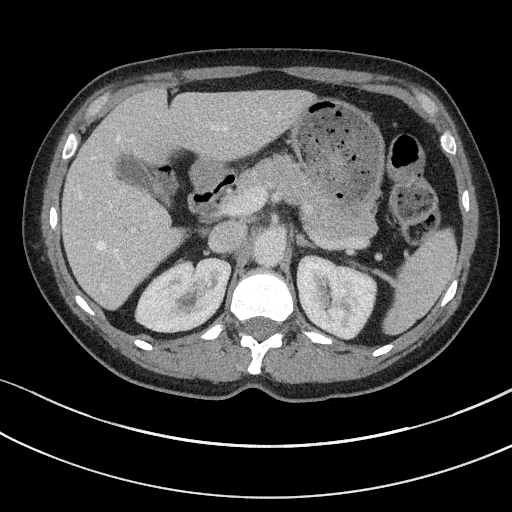
[im 85/149  lung]
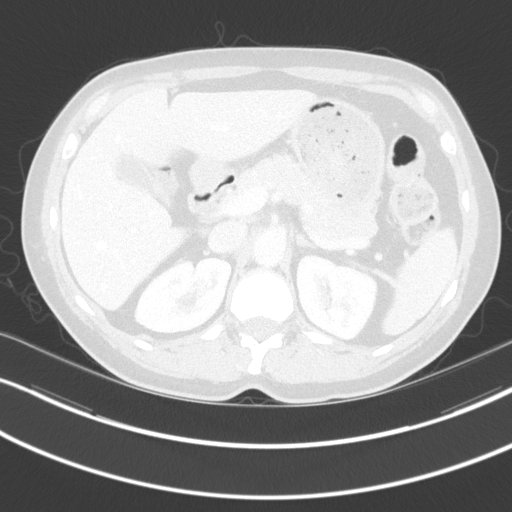
[im 106/149  soft-tissue]
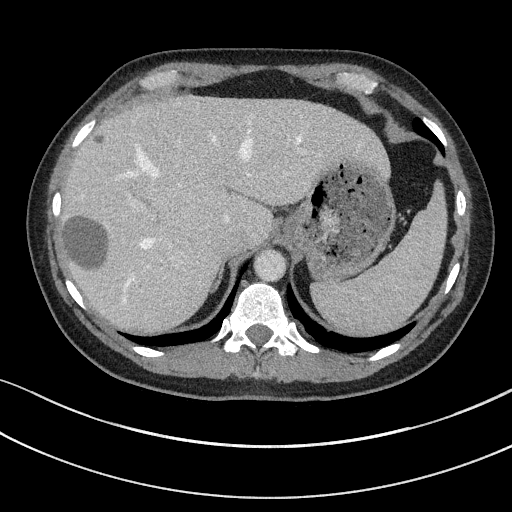
[im 106/149  lung]
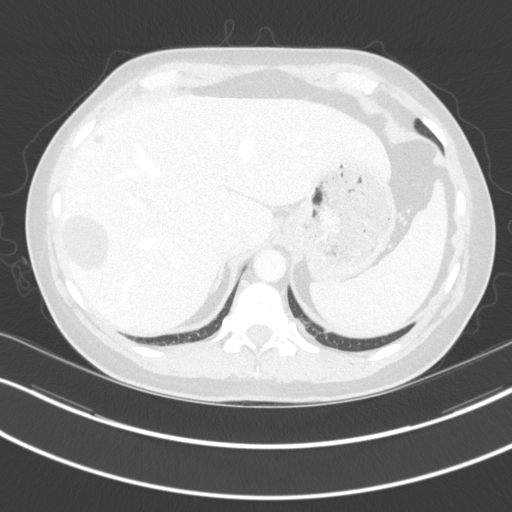
[im 127/149  soft-tissue]
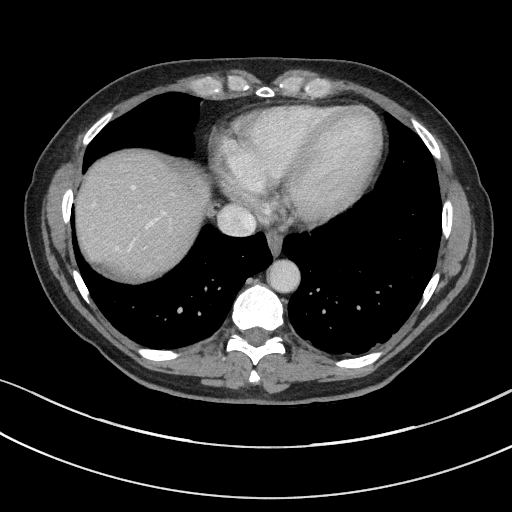
[im 127/149  lung]
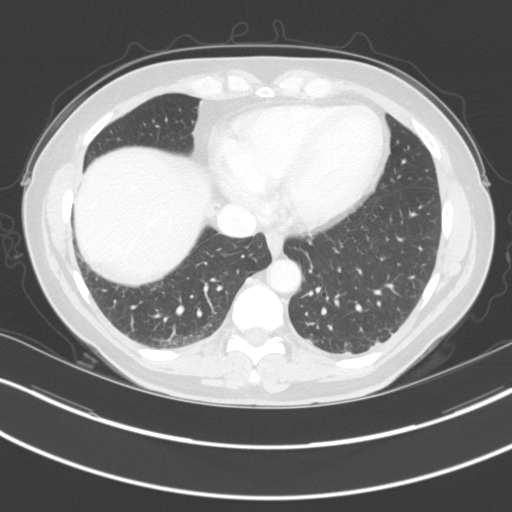

[12 of 46 positions shown; findings below may reference images not displayed]

RADIATION DOSE REDUCTION: This exam was performed according to the
departmental dose-optimization program which includes automated
exposure control, adjustment of the mA and/or kV according to
patient size and/or use of iterative reconstruction technique.

CONTRAST:  80mL OMNIPAQUE IOHEXOL 300 MG/ML  SOLN
FINDINGS: Lower chest: No acute abnormality.

Hepatobiliary: Residual subcapsular right hepatic cyst is decreased
in size, measuring approximately 6.4 x 4.0 x 2.0 cm, previously
x 4.9 x 4.4 cm. Additional scattered simple cysts are seen
throughout the liver interstitial, largest is located in the right
hepatic lobe and measures to 4.3 cm, unchanged compared to prior
exam. Normal appearance of the gallbladder. No biliary ductal
dilation.

Pancreas: Unremarkable. No pancreatic ductal dilatation or
surrounding inflammatory changes.

Spleen: Normal in size without focal abnormality.

Adrenals/Urinary Tract: Adrenal glands are unremarkable. Kidneys are
normal, without renal calculi, focal lesion, or hydronephrosis.

Stomach/Bowel: Stomach is within normal limits. Appendix appears
normal. No evidence of bowel wall thickening, distention, or
inflammatory changes.

Vascular/Lymphatic: No significant vascular findings are present. No
enlarged abdominal or pelvic lymph nodes.

Other: No abdominal wall hernia or abnormality.

Musculoskeletal: No acute or significant osseous findings.
IMPRESSION: Decreased size of previously drained and sclerosed right hepatic
cyst.

## 2024-04-03 ENCOUNTER — Other Ambulatory Visit: Payer: Self-pay | Admitting: Interventional Radiology

## 2024-04-03 DIAGNOSIS — K7689 Other specified diseases of liver: Secondary | ICD-10-CM

## 2024-05-07 NOTE — Progress Notes (Signed)
 [facility] [place]  New Patient Visit Megan Wilkins DOB: 30-Oct-1970  MRN: [id number] Visit Date: 05/07/2024  Encounter Provider: Bernardino JAYSON Level, FNP  Subjective:   Megan Wilkins is a 53 y.o. female who presents to establish care in the practice.  Issues today include:  History of Present Illness The patient is a 53 year old female who presents for a new patient visit to discuss fatigue.  She has been experiencing chronic fatigue for several years. She has undergone a colonoscopy, which yielded normal results. She has also had a mammogram, but due to severe fibroids, the results were inconclusive. She is not due for a Pap smear for another year. She is seeking a referral to a new gynecologist as her previous one has left the practice.   She has a history of vitamin D deficiency, for which she was prescribed a daily supplement. This was later adjusted to a weekly dose of 50,000 units, which she has been taking for the past year.  She is currently on three medications for blood pressure management: amlodipine 5 mg, hydrochlorothiazide 25 mg, and lisinopril  40 mg daily. Additionally, she takes rosuvastatin 5 mg daily for cholesterol management. An MRI revealed some plaque buildup, leading to the initiation of cholesterol medication.  For hot flashes, she takes escitalopram  20 mg, which she reports as being effective. She uses an albuterol  inhaler during allergy attacks and takes levocetirizine daily, increasing the dose during severe attacks. She has previously taken montelukast  and found it beneficial.  She would like a refill on this today.  Marital Status: Married Occupation: Art Gallery Manager  PAST SURGICAL HISTORY: Colonoscopy MRI   Medical History[1] Family History[2] Social History[3] Surgical History[4] Current Medications[5] Allergies[6] There are no active problems to display for this patient.    Review of Systems Review of Systems   Constitutional:  Positive for fatigue.  Allergic/Immunologic: Positive for environmental allergies.     There are no problems to display for this patient.   Medical History[7]  Surgical History[8]    Allergies[9]    Social History   Social History Narrative   Not on file    Family History[10]    Objective:  BP 145/79   Pulse 69   Ht 1.626 m (5' 4)   Wt 70.9 kg (156 lb 3.2 oz)   BMI 26.81 kg/m    Physical Exam Physical Exam Vitals and nursing note reviewed.  Constitutional:      Appearance: Normal appearance. She is normal weight.  HENT:     Head: Normocephalic and atraumatic.  Cardiovascular:     Rate and Rhythm: Normal rate and regular rhythm.     Pulses: Normal pulses.     Heart sounds: Normal heart sounds.  Pulmonary:     Effort: Pulmonary effort is normal. No respiratory distress.     Breath sounds: Normal breath sounds. No wheezing.  Abdominal:     General: Abdomen is flat. Bowel sounds are normal. There is no distension.     Palpations: Abdomen is soft.     Tenderness: There is no abdominal tenderness. There is no guarding.  Musculoskeletal:        General: Normal range of motion.     Cervical back: Normal range of motion and neck supple.  Skin:    General: Skin is warm and dry.  Neurological:     General: No focal deficit present.     Mental Status: She is alert and oriented to person, place, and time. Mental status is at  baseline.  Psychiatric:        Mood and Affect: Mood normal.        Behavior: Behavior normal.        Thought Content: Thought content normal.        Judgment: Judgment normal.        Labs: No results found for this or any previous visit (from the past week).  Assessment/Plan:   Assessment & Plan 1. Chronic Fatigue: - She reports experiencing chronic fatigue for several years. - A comprehensive blood work panel will be conducted to assess iron levels, thyroid function, complete blood count, metabolic panel,  vitamin D, and vitamin B12 levels.  2. Vitamin D Deficiency: - She has been taking vitamin D 50,000 units once a week for the past year. - Her vitamin D levels will be checked to avoid potential toxicity.  3. Hypertension: - She is currently on amlodipine 5 mg, hydrochlorothiazide 25 mg, and lisinopril  40 mg daily.  4. Hyperlipidemia: - She is on rosuvastatin 5 mg daily due to plaque detected on imaging  5. Hot Flashes: - Escitalopram  20 mg daily has significantly reduced her hot flashes.  6. Allergy-Induced Asthma: - She experiences allergy-induced asthma, particularly during severe allergy attacks. - She uses an albuterol  inhaler during severe allergy attacks and takes levocetirizine daily, with an additional dose during severe attacks. A prescription for montelukast  10 mg at bedtime has been provided to manage seasonal allergies.  7. Health Maintenance: - She has had a recent colonoscopy with normal results and a mammogram that was inconclusive due to fibroids. - A referral to a new gynecologist will be made.  Follow-up: The patient will follow up in 2 months for an annual physical examination.  1. Fatigue, unspecified type (Primary) - Vitamin D, 25-Hydroxy - Hemoglobin A1C With Estimated Average Glucose - CBC with Differential - Comprehensive Metabolic Panel - Vitamin B12 - TSH With Reflex To Free T4 - Anemia Profile  2. Primary hypertension  3. Hyperlipidemia, unspecified hyperlipidemia type  4. Seasonal allergies - montelukast  (SINGULAIR ) 10 mg tablet; Take 1 tablet (10 mg total) by mouth at bedtime.  Dispense: 90 tablet; Refill: 3  5. Hot flashes due to menopause  6. Well woman exam - Ambulatory referral to Gynecology; Future  Problem List Items Addressed This Visit   None Visit Diagnoses       Fatigue, unspecified type    -  Primary   Relevant Orders   Vitamin D, 25-Hydroxy   Hemoglobin A1C With Estimated Average Glucose   CBC with Differential    Comprehensive Metabolic Panel   Vitamin B12   TSH With Reflex To Free T4   Anemia Profile     Primary hypertension       Relevant Medications   amLODIPine (NORVASC) 5 mg tablet     Hyperlipidemia, unspecified hyperlipidemia type       Relevant Medications   ergocalciferol (VITAMIN D2) 1,250 mcg (50,000 unit) capsule   rosuvastatin (CRESTOR) 5 mg tablet     Seasonal allergies       Relevant Medications   montelukast  (SINGULAIR ) 10 mg tablet     Hot flashes due to menopause         Well woman exam       Relevant Orders   Ambulatory referral to Gynecology      Please contact my office for worsening conditions or problems, and seek emergency medical treatment and/or call 911 if you or your family deems either necessary.  I  have personally spent 30 minutes involved in face-to-face and non-face-to-face activities for this patient on the day of the visit.  Professional time spent includes the following activities, in addition to those noted in the documentation: Review of outside and prior records including labs, imaging, office notes, counseling of patient regarding nonpharmacologic and pharmacologic management of current conditions, diagnostic testing, differential diagnosis, goals of therapy and documentation.    No follow-ups on file.  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   Astigmatism  [2] Family History Problem Relation Name Age of Onset   Hypertension Mother Chantal 66 - 20   Stroke Mother Merle 73 - 60   Heart disease Mother Merle 53 - 67   Diabetes Father Alm 30 - 39       Caused from drug side effect   Arthritis Father Alm 40 - 49   Hearing loss Father Alm 40 - 49    Heart disease Father Alm 50 - 59   Cancer Maternal Grandmother  70 - 79   Heart disease Paternal Grandfather  0 - 9   Hearing loss Paternal Grandmother  2 - 52  [3] Social History Socioeconomic History   Marital status: Married  Tobacco Use   Smoking status: Never   Smokeless tobacco: Never  Vaping Use   Vaping status: Never Used  Substance and Sexual Activity   Alcohol  use: Yes    Alcohol /week: 1.0 standard drink of alcohol     Types: 1 Glasses of wine per week    Comment: Occasionally   Drug use: Never   Sexual activity: Yes    Partners: Male    Birth control/protection: Post-menopausal, None   Social Drivers of Health   Food Insecurity: Low Risk  (05/07/2024)   Food vital sign    Within the past 12 months, you worried that your food would run out before you got money to buy more: Never true    Within the past 12 months, the food you bought just didn't last and you didn't have money to get more: Never true  Transportation Needs: No Transportation Needs (05/07/2024)   Transportation    In the past 12 months, has lack of reliable transportation kept you from medical appointments, meetings, work or from getting things needed for daily living? : No  Safety: Low Risk  (05/07/2024)   Safety    How often does anyone, including family and friends, physically hurt you?: Never    How often does anyone, including family and friends, insult or talk down to you?: Never    How often does anyone, including family and friends, threaten you with harm?: Never    How often does anyone, including family and friends, scream or curse at you?: Never  Living Situation: Low Risk  (05/07/2024)   Living Situation    What is your living situation today?: I have a steady place to live    Think about the place you live. Do you have problems with any of the following? 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 SHAKE LIQUID, Disp: , Rfl:    EPINEPHrine (EPIPEN) 0.3 mg/0.3 mL injection syringe, 0.3 mg into the thigh as needed. as directed, Disp: , Rfl:    ergocalciferol (VITAMIN D2) 1,250 mcg (50,000 unit) capsule, Take 50,000 Units by mouth once a week., Disp: , Rfl:    escitalopram  (LEXAPRO ) 20 mg tablet, Take 20 mg by mouth daily., Disp: , Rfl:    hydroCHLOROthiazide (HYDRODIURIL) 25 mg tablet, Take 25 mg by mouth daily., Disp: , Rfl:    levocetirizine (XYZAL ) 5 mg tablet, Take 5 mg by mouth nightly., Disp: , Rfl:    lisinopriL  (PRINIVIL ) 40 mg tablet, Take 40 mg by mouth daily., Disp: , Rfl:    rosuvastatin (CRESTOR) 5 mg tablet, Take 5 mg by mouth daily., Disp: , Rfl:    montelukast  (SINGULAIR ) 10 mg tablet, Take 1 tablet (10 mg total) by mouth at bedtime., Disp: 90 tablet, Rfl: 3 [6] Allergies Allergen Reactions   Lidocaine Bleeding, Other (See Comments) and Swelling    Allergic to any caines   Trolamine Salicylate Swelling   Allergenic Ext-Grass Pollen Other (See Comments)   Allergenic Ext-Tree Pollen Other (See Comments)   Bupivacaine Hcl Other (See Comments)   Mepivacaine Swelling   Prilocaine Swelling   Procaine Bleeding and Swelling  [7] Past Medical History: Diagnosis Date   Allergic My whole life   Allergies    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   TONSILLECTOMY  [place]ismas 1996 or 1997  [9] Allergies Allergen Reactions   Lidocaine Bleeding, Other (See Comments) and Swelling    Allergic to any caines   Trolamine Salicylate Swelling   Allergenic Ext-Grass Pollen Other (See Comments)   Allergenic Ext-Tree Pollen Other (See Comments)   Bupivacaine Hcl Other (See Comments)   Mepivacaine Swelling   Prilocaine Swelling   Procaine Bleeding and Swelling  [10] Family History Problem Relation Name Age of Onset   Hypertension Mother Chantal 53 - 75   Stroke Mother Merle 21 - 59   Heart disease Mother Merle 38 - 61   Diabetes Father Alm 30 - 39       Caused from drug side effect   Arthritis Father Alm 40 - 49   Hearing loss Father Alm 40 - 49   Heart disease Father Alm 50 - 59   Cancer Maternal Grandmother  70 - 79   Heart disease Paternal Grandfather  0 - 9   Hearing loss Paternal Grandmother  14 - 48

## 2024-05-08 NOTE — Progress Notes (Signed)
 A1C - pre-diabetes range; focus on low sugar/carb diet  Anemia Profile - low transferrin saturation, low normal iron and ferritin; work on increasing iron-rich foods and start MV with iron  B12 - low normal, can start OTC 2,000 mcg sublingual daily  Vitamin D - low normal.  Still would like her to move from the Rx strength to a daily dose of OTC 2,000 units  TSH - normal  CBC - normal; no evidence of infection or anemia  CMP - normal

## 2024-07-11 NOTE — Progress Notes (Addendum)
 CC: Breast and Pelvic Exam    Chief Complaint  Patient presents with   New GYN    Establish care, hx of fibroids PAP: 2022 per [facility] records Postmenopausal MMG: 11/25/23 neg COLO: 11/24/22 per [facility]     Subjective:   HPI: Megan Wilkins is a 54 y.o. y/o [id number] who presents today for annual exam No LMP recorded. Patient is postmenopausal.   She states she is doing well without any major medical complaints. She states her cycles have not changed from last year and she has a mild to moderate amount of bleeding when she has a period.  She denies any bladder complaints. She does not have any urgency, frequency, hematuria, and stress urinary incontinence. She denies any bowel complaints. She denies any vaginal discharge. Patient denies any breast pains or masses.  She is sexually active without any problems and denies dyspareunia  or change in desire.  ______________________________________________________________________  Surgical History[1]  Allergies[2]  Medical History[3]  OB History  Gravida Para Term Preterm AB Living  3 2 2  1 2   SAB IAB Ectopic Molar Multiple Live Births  1     2    # Outcome Date GA Lbr Len/2nd Weight Sex Type Anes PTL Lv  3 SAB           2 Term      Vag-Spont     1 Term      Vag-Spont         Family History[4]  Social History   Socioeconomic History   Marital status: Married    Spouse name: Not on file   Number of children: Not on file   Years of education: Not on file   Highest education level: Not on file  Occupational History   Not on file  Tobacco Use   Smoking status: Never   Smokeless tobacco: Never  Vaping Use   Vaping status: Never Used  Substance and Sexual Activity   Alcohol  use: Yes    Alcohol /week: 1.0 standard drink of alcohol     Types: 1 Glasses of wine per week    Comment: Occasionally   Drug use: Never   Sexual activity: Yes    Partners: Male    Birth control/protection: Post-menopausal   Other Topics Concern   Not on file  Social History Narrative   Not on file   Social Drivers of Health   Living Situation: Low Risk (05/07/2024)   Living Situation    What is your living situation today?: I have a steady place to live    Think about the place you live. Do you have problems with any of the following? Choose all that apply:: None/None on this list  Food Insecurity: Low Risk (05/07/2024)   Food vital sign    Within the past 12 months, you worried that your food would run out before you got money to buy more: Never true    Within the past 12 months, the food you bought just didn't last and you didn't have money to get more: Never true  Transportation Needs: No Transportation Needs (05/07/2024)   Transportation    In the past 12 months, has lack of reliable transportation kept you from medical appointments, meetings, work or from getting things needed for daily living? : No  Utilities: Low Risk (05/07/2024)   Utilities    In the past 12 months has the electric, gas, oil, or water company threatened to shut off services in your home? : No  Safety: Low Risk (05/07/2024)   Safety    How often does anyone, including family and friends, physically hurt you?: Never    How often does anyone, including family and friends, insult or talk down to you?: Never    How often does anyone, including family and friends, threaten you with harm?: Never    How often does anyone, including family and friends, scream or curse at you?: Never  Alcohol  Screening: Not At Risk (05/07/2024)   Alcohol     Audit C Alcohol  risk score: 0  Tobacco Use: Low Risk (07/11/2024)   Patient History    Smoking Tobacco Use: Never    Smokeless Tobacco Use: Never    Passive Exposure: Not on file  Depression: Not At Risk (05/07/2024)   PHQ-2    PHQ-2 Score: 0  Social Connections: Not on file  Financial Resource Strain: Not on file    Current Medications[5]    Personal info:   33, 32,  married 5 yrs  , 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, art gallery manager    Review Of System:  General: no fever, fatigue, weight or appetite changes Skin: no rashes, lesions, itching, mole change HEENT: no frequent headaches, hearing changes, sore throat, dental problems, sinus problems   Lymph: no tender or swollen lymph nodes  Breasts: no lumps, nipple discharge, breast pain Resp: no SOB, cough, wheezing Cardio: no chest pain, palpitations, edema GI: no N/V/D, constipation, anorexia, bloating, reflux, blood in stool, leakage of stool  GU: no frequency, dysuria, flank pain, hematuria, incontinence, retention       GYN: no change in discharge or odor, itching, inter-menstrual or post-coital bleeding, dyspareunia  Musk: no weakness, arthralgia, joint swelling, ROM limitations Endocrine: no polydypsia, polyphagia, polyuria, heat/cold intolerance, hot flashes, dry skin Psych: no depression, anxiety, panic attacks  RECENT LABS: Results for orders placed or performed in visit on 05/07/24  Vitamin D, 25-Hydroxy   Collection Time: 05/07/24 11:36 AM  Result Value Ref Range   Vitamin D 25-Hydroxy 33.1 30.0 - 100.0 ng/mL  Hemoglobin A1C With Estimated Average Glucose   Collection Time: 05/07/24 11:36 AM  Result Value Ref Range   Hemoglobin A1c 5.9 (H) <5.7 %   Estimated Average Glucose 123 mg/dL  Comprehensive Metabolic Panel   Collection Time: 05/07/24 11:36 AM  Result Value Ref Range   Sodium 139 136 - 145 mmol/L   Potassium 3.6 3.5 - 5.1 mmol/L   Chloride 102 98 - 107 mmol/L   CO2 28 21 - 31 mmol/L   Anion Gap 9 6 - 14 mmol/L   Glucose, Random 97 70 - 99 mg/dL   Blood Urea Nitrogen (BUN) 13 7 - 25 mg/dL   Creatinine 9.25 9.39 - 1.20 mg/dL   eGFR >09 >40 fO/fpw/8.26f7   Albumin 4.8 3.5 - 5.7 g/dL   Total Protein 7.1 6.4 - 8.9 g/dL   Bilirubin, Total 0.4 0.3 - 1.0 mg/dL   Alkaline Phosphatase (ALP) 68 34 - 104 U/L   Aspartate Aminotransferase (AST) 16 13 - 39 U/L   Alanine Aminotransferase (ALT) 15 7 - 52 U/L    Calcium 9.7 8.6 - 10.3 mg/dL   BUN/Creatinine Ratio    Vitamin B12   Collection Time: 05/07/24 11:36 AM  Result Value Ref Range   Vitamin B-12 370 180 - 914 pg/mL  TSH With Reflex To Free T4   Collection Time: 05/07/24 11:36 AM  Result Value Ref Range   TSH 0.814 0.450 - 5.330 uIU/mL  Anemia Profile   Collection Time: 05/07/24 11:36  AM  Result Value Ref Range   Iron 56 50 - 212 ug/dL   Transferrin 680 796 - 362 mg/dL   Ferritin 11 11 - 692 ng/mL   Total Iron Binding Capacity (TIBC) 456 290 - 518 ug/dL   Transferrin Saturation 12 (L) 15 - 45 %  CBC with Differential   Collection Time: 05/07/24 11:36 AM  Result Value Ref Range   WBC 6.00 4.40 - 11.00 10*3/uL   RBC 4.45 4.10 - 5.10 10*6/uL   Hemoglobin 13.1 12.3 - 15.3 g/dL   Hematocrit 62.4 64.0 - 44.6 %   Mean Corpuscular Volume (MCV) 84.4 80.0 - 96.0 fL   Mean Corpuscular Hemoglobin (MCH) 29.4 27.5 - 33.2 pg   Mean Corpuscular Hemoglobin Conc (MCHC) 34.8 33.0 - 37.0 g/dL   Red Cell Distribution Width (RDW) 13.0 12.3 - 17.0 %   Platelet Count (PLT) 204 150 - 450 10*3/uL   Mean Platelet Volume (MPV) 9.3 6.8 - 10.2 fL   Neutrophils % 67 %   Lymphocytes % 24 %   Monocytes % 6 %   Eosinophils % 2 %   Basophils % 1 %   nRBC % 0 %   Neutrophils Absolute 4.00 1.80 - 7.80 10*3/uL   Lymphocytes # 1.40 1.00 - 4.80 10*3/uL   Monocytes # 0.40 0.00 - 0.80 10*3/uL   Eosinophils # 0.10 0.00 - 0.50 10*3/uL   Basophils # 0.10 0.00 - 0.20 10*3/uL   nRBC Absolute 0.00 <=0.00 10*3/uL     Objective:   PHYSICAL EXAM: BP 124/68   Wt 69.9 kg (154 lb)   BMI 26.43 kg/m  Constitutional: Well-developed, well-nourished female in no acute distress Neurological: Alert and oriented to person, place, and time, affect appropriate Psychiatric: Mood and affect appropriate Skin: No rashes or lesions, No suspicious lesions, masses, or ulcers.  Neck: Supple without masses. Trachea is midline.Thyroid is normal size without masses, carotid pulses  normal and no bruits Lymphatics: No cervical, axillary, supraclavicular, or inguinal adenopathy noted Respiratory: Clear to auscultation bilaterally. Good air movement with normal work of  breathing. Cardiovascular: Regular rate and rhythm. Extremities grossly normal, nontender with no edema; pulses regular Gastrointestinal: Soft, nontender, nondistended. No masses or hernias appreciated. No hepatosplenomegaly. No fluid wave. No rebound or guarding. Breast Exam: Appear normal and symmetric without palpable masses, skin changes or nipple inversion.  No discharge, rash, or skin retraction. No palpable lymph nodes. No tenderness, masses, or nipple abnormality Genitourinary:         External Genitalia: Normal female genitalia w/o masses, lesions, rashes    Urethral Meatus: Normal caliber and position    Urethra: Midline, no masses    Bladder: Well-suspended, NT    Vagina: mucosa pink and moist without lesions or ulcers     Cervix: No lesions, normal size and consistency; no cervical motion tenderness     Uterus: Enlarged uterus 12 wk size with irregular contour Adnexa/Parametria: No masses; no parametrial nodularity; no tenderness Perineum/Anus: No lesions, no hemorids, normal specter tone   Chaperone present during exam.    Assessment/Plan:  Megan Wilkins is a 54 y.o. female [phone] who presents for annual exam, possible, history of uterine fibroids, essential hypertension     Pt will follow in 1 year for a physical exam and knows the importance of getting her routine screening labs and procedures.  Calcium supplementation, STD prevention, importance of avoiding cigarettes, avoiding a high fat diet and exercise were discussed with the patient.  She was told that  she needed to do monthly breast exams, get a mammogram yearly starting at age 69, get a lipid profile every 3-5 years, and needed a colonoscopy at 49.  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   TONSILLECTOMY  [place] 1996 or 1997  [2] Allergies Allergen Reactions   Lidocaine Bleeding, Other (See Comments) and Swelling    Allergic to any caines   Trolamine Salicylate Swelling   Allergenic Ext-Grass Pollen Other (See Comments)   Allergenic Ext-Tree Pollen Other (See Comments)   Bupivacaine Hcl Other (See Comments)   Mepivacaine Swelling   Prilocaine Swelling   Procaine Bleeding and Swelling  [3] Past Medical History: Diagnosis Date   Allergies    Arthritis 03/31/2023   Hips   HL (hearing loss) Within the year   Harder to hear with a lot of background noise   Hypertension    Uterine fibroid    Visual impairment Since I was a kid.   Astigmatism  [4] Family History Problem Relation Name Age of Onset   Hypertension Mother Chantal 36 - 21   Stroke Mother Merle 33 - 77   Heart disease Mother Merle 68 - 10   Fibroids Mother Merle    Diabetes Father Alm 30 - 39       Caused from drug side effect   Arthritis Father Alm 59 - 50   Hearing loss Father Alm 40 - 49   Heart disease Father Alm 50 - 59   Colon cancer Maternal Grandmother  93 - 79   Hearing loss Paternal Grandmother  48 - 59   Heart disease Paternal Grandfather  0 - 9   Breast cancer Neg Hx    [5]  Current Outpatient Medications:    amLODIPine (NORVASC) 5 mg tablet, Take 5 mg by mouth daily., Disp: , Rfl:    azelastine -fluticasone  (DYMISTA ) 137-50 mcg/spray spry nasal spray, Administer into each nostril 2 (two) times a day. SHAKE LIQUID, Disp: , Rfl:    EPINEPHrine (EPIPEN) 0.3 mg/0.3 mL injection syringe, 0.3 mg into the thigh as needed. as directed, Disp: , Rfl:    escitalopram  (LEXAPRO ) 20 mg tablet, Take 20 mg by mouth daily., Disp: , Rfl:    hydroCHLOROthiazide  (HYDRODIURIL) 25 mg tablet, Take 1 tablet (25 mg total) by mouth daily., Disp: 90 tablet, Rfl: 0   levocetirizine (XYZAL ) 5 mg tablet, Take 5 mg by mouth nightly., Disp: , Rfl:    lisinopriL  (PRINIVIL ) 40 mg tablet, Take 40 mg by mouth daily., Disp: , Rfl:    montelukast  (SINGULAIR ) 10 mg tablet, Take 1 tablet (10 mg total) by mouth at bedtime., Disp: 90 tablet, Rfl: 3   rosuvastatin (CRESTOR) 5 mg tablet, Take 5 mg by mouth daily., Disp: , Rfl:    albuterol  HFA (PROVENTIL  HFA;VENTOLIN  HFA;PROAIR  HFA) 90 mcg/actuation inhaler, 1 puff. (Patient not taking: Reported on 07/11/2024), Disp: , Rfl:

## 2024-07-11 NOTE — Progress Notes (Addendum)
 Health Maintenance Visit Megan Wilkins DOB: 1970/09/12  MRN: [id number] Visit Date: 07/12/2024  Encounter Provider: Gustav Almarie Pack, FNP  HPI HEALTH MAINTENANCE: Megan Wilkins presents for a health maintenance exam. SCREENING TESTS: ---Last physical exam: Unknown  ---Last breast exam: 11/2023- recommend 1 year  ---She does perform monthly self breast exams. ---Last colonoscopy: 2024-- 10 years  ---Last PAP smear: 07/11/24 ---Last mammogram: 11/2023- recommend 1 year  LIFESTYLE:  ---Sleep hours: 8 hours ---Seatbelt use: always ---Diet: Tries to make healthy choices. Sugar in moderation.  ---Exercise: Walking  LAB/IMMUNIZATIONS: ---Last lipid profile: Unknown  ---Last tetanus vaccine: 7 years ago  ---Last influenza vaccine: October 2025  ---Last COVID vaccine: 5  ---Last pneumococcal vaccine: patient states she had one years ago, declines updating today  ---Shingrix vaccine: complete 2022    MEDICATIONS: OTC Vitamins: Womens 50+, Iron tablet, B12, Vit D2 Other OTC Meds: Colace   Family history of breast/ovarian/uterine cancer: no.  Family history of colon cancer: yes, grandmother.  Family history of premature death from CVD: no.  Family history of hyperlipidemia or hypertension: yes.  HYPERTENSION MANAGEMENT:  MEDICATION: Amlodopine 5mg  daily, lisinopril  40 mg daily, Hydrochlorothiazide 25 mg daily Patient is here for hypertension follow-up. SYMPTOMS: Pertinent negatives - none HABITS: --Patient has been following a reduced sodium diet. -- She is getting adequate exercise. HOME BLOOD PRESSURE: Does check BP at home. RISK FACTORS: Smoking: No Exercise: Walking   LIPID MANAGEMENT: MEDICATION: Crestor 5mg  daily  SYMPTOMS: Pertinent negatives - none WEIGHT: not significantly changed  HABITS: -- She is getting adequate exercise. --Smoking: NO No results found for: CHOL, HDL, LDLCALC, LDLDIRECT, TRIG, CHOLHDL  HOT FLASHES MEDICATION:  Lexapro  20 mg daily Has been on for 2-3 years, it controls her hot flashes It does not completely stop them but has decreased them drastically She is happy with the current dose/progress   SEASONAL ALLERGIES  MEDICATION: Singulair  10 mg and Xyzal  5mg , Dymista  nasal spray Allergies occur year around, worse in the winter time with dust, dust mites, pine trees Associated symptoms: congestion, itchy eyes, postnasal drip She uses albuterol  as needed   FATIGUE Started daily iron in November due to lower iron levels. She was having low energy and increased fatigue. Has not changed much since being on iron. She is not tolerating the iron well, she continues to struggle with constipation daily. She continues to have increased fatigue. She has been taking a daily multi-vitamin, b12, vitamin d also.   ROS: Review of Systems  Constitutional: Negative.  Negative for activity change, chills, fatigue, fever and unexpected weight change.  HENT:  Positive for congestion, nosebleeds, postnasal drip and rhinorrhea. Negative for ear discharge, ear pain, sinus pressure, sinus pain, sneezing, sore throat and trouble swallowing.   Eyes:  Negative for photophobia, pain, redness and visual disturbance.  Respiratory:  Negative for cough, chest tightness, shortness of breath and wheezing.   Cardiovascular:  Negative for chest pain, palpitations and leg swelling.  Gastrointestinal:  Positive for constipation and rectal pain (from constipation). Negative for abdominal distention, abdominal pain, blood in stool, diarrhea, nausea and vomiting.  Endocrine: Negative.   Genitourinary: Negative.  Negative for decreased urine volume, difficulty urinating, dysuria, flank pain, frequency, hematuria, pelvic pain and urgency.  Musculoskeletal: Negative.   Skin: Negative.   Allergic/Immunologic: Negative.   Neurological: Negative.  Negative for dizziness, tremors, seizures, weakness, light-headedness and headaches.   Hematological: Negative.   Psychiatric/Behavioral:  Negative for sleep disturbance. The patient is not nervous/anxious.  Behavioral Health Screening  Patient Health Questionnaire-2 Score: 0 (07/12/2024  8:10 AM)      Patient's Depression screening is Negative   Depression Plan: Normal/Negative Screening  Medical History: Medical History[1]  There are no active problems to display for this patient.  Current Medications:  Medications Ordered Prior to Encounter[2] Current Medications[3]  Allergies: Allergies[4]   Immunizations:  There is no immunization history on file for this patient.  Surgical History- Surgical History[5]  Family history- Family History[6] Social history- Social History[7]   Physical Exam: BP 120/70   Pulse 65   Temp 98.4 F (36.9 C)   Ht 1.651 m (5' 5)   Wt 70.6 kg (155 lb 9.6 oz)   SpO2 96%   BMI 25.89 kg/m  Wt Readings from Last 3 Encounters:  07/12/24 70.6 kg (155 lb 9.6 oz)  07/11/24 69.9 kg (154 lb)  05/07/24 70.9 kg (156 lb 3.2 oz)   Physical Exam Vitals reviewed.  Constitutional:      Appearance: Normal appearance.  HENT:     Right Ear: Tympanic membrane, ear canal and external ear normal. There is no impacted cerumen.     Left Ear: Tympanic membrane, ear canal and external ear normal. There is no impacted cerumen.     Nose: Nose normal. No congestion or rhinorrhea.     Mouth/Throat:     Mouth: Mucous membranes are moist.     Pharynx: Oropharynx is clear. No oropharyngeal exudate or posterior oropharyngeal erythema.  Eyes:     General:        Right eye: No discharge.        Left eye: No discharge.     Extraocular Movements: Extraocular movements intact.     Conjunctiva/sclera: Conjunctivae normal.     Pupils: Pupils are equal, round, and reactive to light.  Cardiovascular:     Rate and Rhythm: Normal rate and regular rhythm.     Pulses: Normal pulses.     Heart sounds: Normal heart sounds. No murmur heard. Pulmonary:      Effort: Pulmonary effort is normal. No respiratory distress.     Breath sounds: Normal breath sounds. No wheezing.  Chest:     Chest wall: No tenderness.  Abdominal:     General: Abdomen is flat. Bowel sounds are normal. There is no distension.     Palpations: Abdomen is soft.     Tenderness: There is no abdominal tenderness. There is no guarding.  Musculoskeletal:        General: Normal range of motion.     Cervical back: Normal range of motion and neck supple. No tenderness.  Lymphadenopathy:     Cervical: No cervical adenopathy.  Skin:    General: Skin is warm.  Neurological:     General: No focal deficit present.     Mental Status: She is alert and oriented to person, place, and time.  Psychiatric:        Mood and Affect: Mood normal.        Behavior: Behavior normal.        Thought Content: Thought content normal.        Judgment: Judgment normal.    Assessment and Plan: 1. Annual physical exam (Primary) -  reviewed recommendations for heart healthy diet and routine exercise with goal of 150 min/week.  - immunizations reviewed and counseled - pap smear-- last one 07/11/24  - colon cancer screening-- due 2034 - mammogram-- due in June   2. Seasonal allergies - levocetirizine (XYZAL )  5 mg tablet; Take 1 tablet (5 mg total) by mouth nightly.  Dispense: 90 tablet; Refill: 1 - montelukast  (SINGULAIR ) 10 mg tablet; Take 1 tablet (10 mg total) by mouth at bedtime.  Dispense: 90 tablet; Refill: 1 - azelastine -fluticasone  (DYMISTA ) 137-50 mcg/spray spry nasal spray; Administer 1 spray into each nostril 2 (two) times a day. SHAKE LIQUID  Dispense: 23 g; Refill: 1  Controlled on current medications. Refills sent in.   3. Hyperlipidemia, unspecified hyperlipidemia type - Lipid Panel - rosuvastatin (CRESTOR) 5 mg tablet; Take 1 tablet (5 mg total) by mouth daily.  Dispense: 90 tablet; Refill: 1  Stable on current medication. Continue working on diet/exercise. Refill sent  in  4. Primary hypertension - CBC with Differential - Comprehensive Metabolic Panel - hydroCHLOROthiazide (HYDRODIURIL) 25 mg tablet; Take 1 tablet (25 mg total) by mouth daily.  Dispense: 90 tablet; Refill: 1 - lisinopril  (PRINIVIL ) 40 mg tablet; Take 1 tablet (40 mg total) by mouth daily.  Dispense: 90 tablet; Refill: 1 - amLODIPine (NORVASC) 5 mg tablet; Take 1 tablet (5 mg total) by mouth daily.  Dispense: 90 tablet; Refill: 1  Controlled on current medication. Labs collected. Refills sent in.   5. Low iron - Anemia Profile - CBC with Differential  She had a lower iron level in November and was started on Iron. She continues to take this but has not noticed an increase in energy. Continues to have increased fatigue. She is not tolerating iron well, she struggles with constipation. Continue taking Colace and increase fluid intake. Levels will be rechecked today   6. Hot flashes due to menopause - escitalopram  (LEXAPRO ) 20 mg tablet; Take 1 tablet (20 mg total) by mouth daily.  Dispense: 90 tablet; Refill: 1  Hot flashes have improved with daily lexapro . Refills sent in.   7. History of vitamin D deficiency - Vitamin D, 25-Hydroxy  8. Fatigue, unspecified type - Vitamin D, 25-Hydroxy - Vitamin B12  She has been dealing with increased fatigue for months. Has been taking a daily multi-vitamin, vitamin d, vitamin b12 and iron daily. Will check labs today to see where levels are   9. Skin cancer screening  Referral placed to dermatology for skin cancer screening    Return in about 6 months (around 01/09/2025) for med-check .   Patient Instructions  Use saline nasal sprays several times per day to keep nasal passages moist. It will thin out the stuffiness so the mucus in your sinus cavities can drain out. It will also help dilute the mucus so that whatever is dripping down the back of your throat won't be as irritating to your airways and breathing.   Problem List Items Addressed  This Visit   None Visit Diagnoses       Annual physical exam    -  Primary     Seasonal allergies       Relevant Medications   levocetirizine (XYZAL ) 5 mg tablet   montelukast  (SINGULAIR ) 10 mg tablet   azelastine -fluticasone  (DYMISTA ) 137-50 mcg/spray spry nasal spray     Hyperlipidemia, unspecified hyperlipidemia type       Relevant Medications   rosuvastatin (CRESTOR) 5 mg tablet   Other Relevant Orders   Lipid Panel     Primary hypertension       Relevant Medications   hydroCHLOROthiazide (HYDRODIURIL) 25 mg tablet   lisinopril  (PRINIVIL ) 40 mg tablet   amLODIPine (NORVASC) 5 mg tablet   Other Relevant Orders   CBC with Differential  Comprehensive Metabolic Panel     Low iron       Relevant Orders   Anemia Profile   CBC with Differential     Hot flashes due to menopause       Relevant Medications   escitalopram  (LEXAPRO ) 20 mg tablet     History of vitamin D deficiency       Relevant Orders   Vitamin D, 25-Hydroxy     Fatigue, unspecified type       Relevant Orders   Vitamin D, 25-Hydroxy   Vitamin B12       Please contact my office for worsening conditions or problems, and seek emergency medical treatment and/or call 911 if you or your family deems either necessary.  This document was created using the aid of voice recognition Scientist, clinical (histocompatibility and immunogenetics).    I have personally spent 30 minutes involved in face-to-face and non-face-to-face activities for this patient on the day of the visit in addition to the CPE.  Professional time spent includes the following activities, in addition to those noted in the documentation:  - preparing to see the patient (e.g., review of recent and/or remote lab/imaging/study results, provider notes, and patient messages/phone calls available in current EMR, CareEverywhere, and scanned records) -obtaining and/or reviewing separately obtained history either through past provider notes, patient phone calls, and/or patient's family  member(s)/caregiver(s) -performing a medically appropriate examination and/or evaluation -counseling and educating the patient/family/caregiver -ordering medications, tests, or procedures -documenting clinical information in the electronic or other health record -reviewing most up to date studies or expert consensus guidelines for screening/diagnosing/treating pertinent conditions/symptoms -independently interpreting results (not separately reported) and communicating results to the patient/family/caregiver -care coordination (not separately reported) -referring and communicating with other health care professionals (when not separately reported)   Electronically signed by: Gustav Almarie Pack, FNP 07/12/2024 8:46 AM       [1] Past Medical History: Diagnosis Date   Allergies    Arthritis 03/31/2023   Hips   HL (hearing loss) Within the year   Harder to hear with a lot of background noise   Hypertension    Uterine fibroid    Visual impairment Since I was a kid.   Astigmatism  [2] Current Outpatient Medications on File Prior to Visit  Medication Sig Dispense Refill   albuterol  HFA (PROVENTIL  HFA;VENTOLIN  HFA;PROAIR  HFA) 90 mcg/actuation inhaler 1 puff.     EPINEPHrine (EPIPEN) 0.3 mg/0.3 mL injection syringe 0.3 mg into the thigh as needed. as directed     [DISCONTINUED] amLODIPine (NORVASC) 5 mg tablet Take 5 mg by mouth daily.     [DISCONTINUED] azelastine -fluticasone  (DYMISTA ) 137-50 mcg/spray spry nasal spray Administer into each nostril 2 (two) times a day. SHAKE LIQUID     [DISCONTINUED] escitalopram  (LEXAPRO ) 20 mg tablet Take 1 tablet (20 mg total) by mouth daily. 90 tablet 3   [DISCONTINUED] hydroCHLOROthiazide (HYDRODIURIL) 25 mg tablet Take 1 tablet (25 mg total) by mouth daily. 90 tablet 0   [DISCONTINUED] levocetirizine (XYZAL ) 5 mg tablet Take 5 mg by mouth nightly.     [DISCONTINUED] lisinopriL  (PRINIVIL ) 40 mg tablet Take 40 mg by mouth daily.      [DISCONTINUED] montelukast  (SINGULAIR ) 10 mg tablet Take 1 tablet (10 mg total) by mouth at bedtime. 90 tablet 3   [DISCONTINUED] rosuvastatin (CRESTOR) 5 mg tablet Take 5 mg by mouth daily.     No current facility-administered medications on file prior to visit.  [3]  Current Outpatient Medications:    albuterol  HFA (  PROVENTIL  HFA;VENTOLIN  HFA;PROAIR  HFA) 90 mcg/actuation inhaler, 1 puff., Disp: , Rfl:    EPINEPHrine (EPIPEN) 0.3 mg/0.3 mL injection syringe, 0.3 mg into the thigh as needed. as directed, Disp: , Rfl:    amLODIPine (NORVASC) 5 mg tablet, Take 1 tablet (5 mg total) by mouth daily., Disp: 90 tablet, Rfl: 1   azelastine -fluticasone  (DYMISTA ) 137-50 mcg/spray spry nasal spray, Administer 1 spray into each nostril 2 (two) times a day. SHAKE LIQUID, Disp: 23 g, Rfl: 1   escitalopram  (LEXAPRO ) 20 mg tablet, Take 1 tablet (20 mg total) by mouth daily., Disp: 90 tablet, Rfl: 1   hydroCHLOROthiazide (HYDRODIURIL) 25 mg tablet, Take 1 tablet (25 mg total) by mouth daily., Disp: 90 tablet, Rfl: 1   levocetirizine (XYZAL ) 5 mg tablet, Take 1 tablet (5 mg total) by mouth nightly., Disp: 90 tablet, Rfl: 1   lisinopril  (PRINIVIL ) 40 mg tablet, Take 1 tablet (40 mg total) by mouth daily., Disp: 90 tablet, Rfl: 1   montelukast  (SINGULAIR ) 10 mg tablet, Take 1 tablet (10 mg total) by mouth at bedtime., Disp: 90 tablet, Rfl: 1   rosuvastatin (CRESTOR) 5 mg tablet, Take 1 tablet (5 mg total) by mouth daily., Disp: 90 tablet, Rfl: 1 [4] Allergies Allergen Reactions   Lidocaine Bleeding, Other (See Comments) and Swelling    Allergic to any caines   Trolamine Salicylate Swelling   Allergenic Ext-Grass Pollen Other (See Comments)   Allergenic Ext-Tree Pollen Other (See Comments)   Bupivacaine Hcl Other (See Comments)   Mepivacaine Swelling   Prilocaine Swelling   Procaine Bleeding and Swelling  [5] Past Surgical History: Procedure Laterality Date   CERVICAL BIOPSY  W/  LOOP ELECTRODE EXCISION  1994   EYE SURGERY  2018   Retna repair   JOINT REPLACEMENT Left 09/02/2023   Total Hip   TONSILLECTOMY  [place] 1996 or 1997  [6] Family History Problem Relation Name Age of Onset   Hypertension Mother Chantal 36 - 65   Stroke Mother Merle 57 - 59   Heart disease Mother Merle 47 - 49   Fibroids Mother Merle    Diabetes Father Alm 30 - 39       Caused from drug side effect   Arthritis Father Alm 40 - 49   Hearing loss Father Alm 40 - 49   Heart disease Father Alm 50 - 59   Colon cancer Maternal Grandmother  82 - 79   Hearing loss Paternal Grandmother  56 - 59   Heart disease Paternal Grandfather  0 - 9   Breast cancer Neg Hx    [7] Social History Socioeconomic History   Marital status: Married  Tobacco Use   Smoking status: Never   Smokeless tobacco: Never  Vaping Use   Vaping status: Never Used  Substance and Sexual Activity   Alcohol  use: Yes    Alcohol /week: 1.0 standard drink of alcohol     Types: 1 Glasses of wine per week    Comment: Occasionally   Drug use: Never   Sexual activity: Yes    Partners: Male    Birth control/protection: Post-menopausal   Social Drivers of Health   Living Situation: Low Risk (07/12/2024)   Living Situation    What is your living situation today?: I have a steady place to live    Think about the place you live. Do you have problems with any of the following? Choose all that apply:: None/None on this list  Food Insecurity: Low Risk (07/12/2024)   Food  vital sign    Within the past 12 months, you worried that your food would run out before you got money to buy more: Never true    Within the past 12 months, the food you bought just didn't last and you didn't have money to get more: Never true  Transportation Needs: No Transportation Needs (07/12/2024)   Transportation    In the past 12 months, has lack of reliable transportation kept you from medical appointments, meetings, work  or from getting things needed for daily living? : No  Utilities: Low Risk (07/12/2024)   Utilities    In the past 12 months has the electric, gas, oil, or water company threatened to shut off services in your home? : No  Safety: Low Risk (07/12/2024)   Safety    How often does anyone, including family and friends, physically hurt you?: Never    How often does anyone, including family and friends, insult or talk down to you?: Never    How often does anyone, including family and friends, threaten you with harm?: Never    How often does anyone, including family and friends, scream or curse at you?: Never  Alcohol  Screening: Not At Risk (05/07/2024)   Alcohol     Audit C Alcohol  risk score: 0  Tobacco Use: Low Risk (07/12/2024)   Patient History    Smoking Tobacco Use: Never    Smokeless Tobacco Use: Never  Depression: Not At Risk (07/12/2024)   PHQ-2    PHQ-2 Score: 0

## 2024-08-01 ENCOUNTER — Other Ambulatory Visit

## 2024-08-03 ENCOUNTER — Other Ambulatory Visit
